# Patient Record
Sex: Female | Born: 1985 | Race: White | Hispanic: No | Smoking: Never smoker
Health system: Southern US, Community
[De-identification: ages and names within clinical notes are randomized; demographics above are authoritative.]

## PROBLEM LIST (undated history)

## (undated) DIAGNOSIS — F4310 Post-traumatic stress disorder, unspecified: Secondary | ICD-10-CM

## (undated) DIAGNOSIS — E559 Vitamin D deficiency, unspecified: Secondary | ICD-10-CM

## (undated) DIAGNOSIS — F909 Attention-deficit hyperactivity disorder, unspecified type: Secondary | ICD-10-CM

## (undated) DIAGNOSIS — G4700 Insomnia, unspecified: Secondary | ICD-10-CM

## (undated) DIAGNOSIS — R5383 Other fatigue: Secondary | ICD-10-CM

## (undated) HISTORY — DX: Vitamin D deficiency, unspecified: E55.9

## (undated) HISTORY — PX: OTHER SURGICAL HISTORY: SHX169

## (undated) HISTORY — PX: PANCREAS SURGERY: SHX731

## (undated) HISTORY — DX: Attention-deficit hyperactivity disorder, unspecified type: F90.9

## (undated) HISTORY — DX: Other fatigue: R53.83

## (undated) HISTORY — DX: Insomnia, unspecified: G47.00

## (undated) HISTORY — DX: Post-traumatic stress disorder, unspecified: F43.10

---

## 2009-09-29 ENCOUNTER — Emergency Department: Payer: Self-pay | Admitting: Emergency Medicine

## 2010-11-27 ENCOUNTER — Observation Stay: Payer: Self-pay | Admitting: Obstetrics and Gynecology

## 2010-12-02 ENCOUNTER — Observation Stay: Payer: Self-pay | Admitting: Obstetrics and Gynecology

## 2010-12-13 ENCOUNTER — Inpatient Hospital Stay: Payer: Self-pay

## 2011-05-19 ENCOUNTER — Emergency Department: Payer: Self-pay | Admitting: *Deleted

## 2013-08-31 ENCOUNTER — Ambulatory Visit: Payer: Self-pay | Admitting: Obstetrics and Gynecology

## 2013-08-31 LAB — HEMOGLOBIN: HGB: 13.9 g/dL (ref 12.0–16.0)

## 2013-08-31 LAB — HCG, QUANTITATIVE, PREGNANCY: BETA HCG, QUANT.: 3 m[IU]/mL

## 2013-09-05 LAB — PATHOLOGY REPORT

## 2014-04-12 ENCOUNTER — Emergency Department: Payer: Self-pay | Admitting: Emergency Medicine

## 2014-04-12 LAB — HCG, QUANTITATIVE, PREGNANCY: BETA HCG, QUANT.: 13378 m[IU]/mL — AB

## 2014-07-07 ENCOUNTER — Observation Stay: Payer: Self-pay | Admitting: Obstetrics and Gynecology

## 2014-07-07 LAB — URINALYSIS, COMPLETE
Bacteria: NONE SEEN
Bilirubin,UR: NEGATIVE
Blood: NEGATIVE
GLUCOSE, UR: NEGATIVE mg/dL (ref 0–75)
LEUKOCYTE ESTERASE: NEGATIVE
NITRITE: NEGATIVE
PH: 8 (ref 4.5–8.0)
Protein: NEGATIVE
RBC,UR: <1 /HPF (ref 0–5)
Specific Gravity: 1.006 (ref 1.003–1.030)
WBC UR: <1 /HPF (ref 0–5)

## 2014-09-04 ENCOUNTER — Observation Stay: Payer: Self-pay

## 2014-09-07 ENCOUNTER — Observation Stay: Payer: Self-pay | Admitting: Obstetrics & Gynecology

## 2014-09-10 ENCOUNTER — Inpatient Hospital Stay: Payer: Self-pay | Admitting: Obstetrics and Gynecology

## 2014-09-12 LAB — HEMATOCRIT: HCT: 32.3 % — AB (ref 35.0–47.0)

## 2014-10-12 NOTE — Op Note (Signed)
PATIENT NAME:  Megan Pacheco, Megan Pacheco MR#:  [id number] DATE OF BIRTH:  10-Jun-1986  DATE OF PROCEDURE:    PREOPERATIVE DIAGNOSIS:  Incomplete abortion.   POSTOPERATIVE DIAGNOSIS:  Incomplete abortion.   PROCEDURE:  Suction dilation and curettage.   SURGEON:  Laverta Baltimore, M.D.   ANESTHESIA:  General endotracheal anesthesia.   INDICATIONS:  This is a 29 year old gravida 3, para 1 patient at 10 weeks with heavy intermittent bleeding for the last four weeks.  Quantitative hCG going from 220 to 200.  Blood type O+.   PROCEDURE:  After adequate general endotracheal anesthesia, the patient was placed in the dorsal supine position with the legs in the candy cane stirrups.  Lower abdomen, perineal and vagina were prepped with Betadine.  Weighted speculum was placed in the posterior vaginal vault.  The bladder was straight catheterized yielding 150 mL of clear urine.  Anterior cervix was grasped with a single-tooth tenaculum.  Cervix was dilated to #20 Hanks dilator without difficulty.  A #7 Flexible curettage suction curette was advanced into the endometrial cavity and scant blood clot tissue was removed.  Sharp curettage was performed with good uterine cry throughout and a repeat suction curette yielded no additional tissue or blood.  Good hemostasis was noted.  There were no complications.  The patient tolerated the procedure well and was taken to the recovery room in good condition.   ESTIMATED BLOOD LOSS:  Minimal.   INTRAOPERATIVE FLUIDS:  500 mL.   URINE OUTPUT:  150 mL.     ____________________________ Boykin Nearing, MD tjs:ea D: 08/31/2013 16:46:45 ET T: 09/01/2013 06:14:34 ET JOB#: [id number]  cc: Boykin Nearing, MD, <Dictator> Boykin Nearing MD ELECTRONICALLY SIGNED 09/08/2013 11:24

## 2014-10-29 NOTE — H&P (Signed)
L&D Evaluation:  History:  HPI -CC: abdominal pain x 2wks, ?SROM at noon today -HPI: 29 y/o G31011 @ 30/2 (9wk u/s, [facility] 3/25) with above CC. preg c/b anxiety (on zoloft), umbilical hernia. Has been having lower abdominal pain for the past two weeks but states it has felt worse over the past day or two and coming on about q47m. took a shower at noon and had ?LOF that was clear; none since. No VB, decreased FM. no s/s of pre-x and nothing in the vagina in past 1-2 days prior to admission today.   Allergies NKDA   Exam:  Vital Signs af vs normal and stable with admit bp 120/94, rpt 113/76   General no apparent distress   Mental Status clear   Abdomen gravid, non-tender   Pelvic per RN closed internal, 1cm ext   Mebranes Intact   FHT 135 baseline, +accels, no decels, mod var   Ucx q2-54m   Other fern and nitrazine neg SSE: cx visually closed, no pooling and neg cough test   Impression:  Impression labor eval   Plan:  Comments *IUP: category I tracing *EOL: unfortunately cant do ffn given SVE by RN. rpt SVE in 1-2 hours. PO hydration and apap -follow up u/a   Electronic Signatures: Aletha Halim (MD)  (Signed 17-Jan-16 13:51)  Authored: L&D Evaluation   Last Updated: 17-Jan-16 13:51 by Aletha Halim (MD)

## 2014-10-29 NOTE — H&P (Signed)
L&D Evaluation:  History:  HPI -CC: C/O contractions x 2 days worsening in intensity this AM at 0430. Headaches, nausea, vision changes x 2 days. -HPI: 29 y/o G31011 @ 38.5 wks (9wk u/s, [facility] 3/25) with above CC. Headache is bitemporal and accompanied by blurred vision. Has a hx of migraines treated with Imetrex and percocet prior to pregnancy and Tylenol during pregnancy. Tylenol did not help headache. nausea began with headaches. Was able to keep down food last night, but this AM has only drank a little water. Stayed in bed till about 11AM,due to feeling nauseous and weak and contracting. Contraction started 2 days ago and were  infrequent (2x/hr), but became more regular this AM. Has had some scintillating scotomata with bending over/ changing positions. Vision has been a little blurry with the headache. No VB or LOF.  PNC at [facility] and c/b anxiety (on zoloft 50 mgm daily currently; was on paxil, xanax and nortriptyline before pregnancy), anemia, and umbilical hernia. 34# weight gain with pregnancy. Hx of SVD 8#7oz  female in 2012. Had gestational HTN with G1. LABS: O POS/ RNI/VI/ GBS positive.   Presents with contractions, headaches, visual changes and nausea   Patient's Medical History Migraines/ Anxiety/ Missed AB   Patient's Surgical History D&C  knee surgery   Medications Pre Natal Vitamins  Tylenol (Acetaminophen)  Zoloft 50 mgm daily   Allergies NKDA   Social History none   Family History Non-Contributory   ROS:  ROS see HPI   Exam:  Vital Signs on arrival 129/94, then 119/82, 117/86   Urine Protein trace to +1   General breathing with some contractions   Mental Status clear   Chest clear   Heart normal sinus rhythm, no murmur/gallop/rubs   Abdomen gravid, tender with contractions   Estimated Fetal Weight Average for gestational age   Fetal Position cephalic   Edema no edema   Reflexes 3+   Clonus negative   Pelvic no external lesions, closed/ 80%/-1    Mebranes Intact   FHT normal rate with no decels, reactive-Cat1   Ucx regular, q73min, some palpate mod   Skin dry   Impression:  Impression IUP at 38.5 weeks with headache, nausea,  contractions. R/O early labor. R/o preeclampsia/GTN. R/O migraine.   Plan:  Plan EFM/NST, monitor contractions and for cervical change, On arrival drew [facility] panel and sent PC ratio. IV hydration with IV Zofran administered with some decrease in nausea.   Comments LABS: BUN/cr=10/0.76, uric acid=7.6, H&H=11.3gm/dl&34.5%, plt=201. K=4.0,         Na+=134. PC ratio 110 mgm.  Feeling a little less nauseous after Zofran 4 mgm IV. BP=119/75. Headache is persisting although is now right temporal area. Contractions q3 min but cx is 0.5cm/50%/-3 and ballotable.  Will give morphine and Phenergan IM for ctx pain and headache and reevaluate in 2-3 hours. Continue IV hydration.   Electronic Signatures: Karene Fry (CNM)  (Signed 16-Mar-16 15:26)  Authored: L&D Evaluation   Last Updated: 16-Mar-16 15:26 by Karene Fry (CNM)

## 2015-01-22 LAB — CBC WITH DIFFERENTIAL/PLATELET
Basophil #: 0 10*3/uL (ref 0.0–0.1)
Basophil %: 0.3 %
EOS ABS: 0.1 10*3/uL (ref 0.0–0.7)
EOS PCT: 1 %
HCT: 33.7 % — ABNORMAL LOW (ref 35.0–47.0)
HGB: 10.7 g/dL — AB (ref 12.0–16.0)
Lymphocyte #: 2 10*3/uL (ref 1.0–3.6)
Lymphocyte %: 21.2 %
MCH: 27.2 pg (ref 26.0–34.0)
MCHC: 31.9 g/dL — AB (ref 32.0–36.0)
MCV: 85 fL (ref 80–100)
MONO ABS: 0.8 x10 3/mm (ref 0.2–0.9)
MONOS PCT: 8.5 %
Neutrophil #: 6.5 10*3/uL (ref 1.4–6.5)
Neutrophil %: 69 %
Platelet: 152 10*3/uL (ref 150–440)
RBC: 3.94 10*6/uL (ref 3.80–5.20)
RDW: 17 % — AB (ref 11.5–14.5)
WBC: 9.4 10*3/uL (ref 3.6–11.0)

## 2016-12-27 ENCOUNTER — Other Ambulatory Visit: Payer: Self-pay | Admitting: Family

## 2016-12-27 DIAGNOSIS — R935 Abnormal findings on diagnostic imaging of other abdominal regions, including retroperitoneum: Secondary | ICD-10-CM

## 2016-12-30 ENCOUNTER — Ambulatory Visit
Admission: RE | Admit: 2016-12-30 | Discharge: 2016-12-30 | Disposition: A | Discharge status: Home / Self Care | Payer: Self-pay | Source: Ambulatory Visit | Attending: Family | Admitting: Family

## 2016-12-30 ENCOUNTER — Encounter: Payer: Self-pay | Admitting: Radiology

## 2016-12-30 DIAGNOSIS — R935 Abnormal findings on diagnostic imaging of other abdominal regions, including retroperitoneum: Secondary | ICD-10-CM

## 2016-12-30 DIAGNOSIS — R198 Other specified symptoms and signs involving the digestive system and abdomen: Secondary | ICD-10-CM | POA: Insufficient documentation

## 2016-12-30 DIAGNOSIS — K862 Cyst of pancreas: Secondary | ICD-10-CM | POA: Insufficient documentation

## 2017-01-18 NOTE — Progress Notes (Signed)
  Oncology Nurse Navigator Documentation Order for EUS has been received from [place] at [facility]. No availability at [facility]. Order sent to [facility] with confirmation of receipt. They will contact her with date/time/instructions for procedure. Navigator Location: [facility] (01/18/17 1100)   )Navigator Encounter Type: Telephone;Letter/Fax/Email (01/18/17 1100)                         Barriers/Navigation Needs: Coordination of Care (01/18/17 1100)   Interventions: Coordination of Care (01/18/17 1100)   Coordination of Care: EUS (01/18/17 1100)                  Time Spent with Patient: 30 (01/18/17 1100)

## 2017-12-14 ENCOUNTER — Encounter: Payer: Self-pay | Admitting: Emergency Medicine

## 2017-12-14 ENCOUNTER — Inpatient Hospital Stay
Admission: EM | Admit: 2017-12-14 | Discharge: 2017-12-16 | DRG: 394 | Disposition: A | Discharge status: Home / Self Care | Payer: Medicaid Other | Attending: Internal Medicine | Admitting: Internal Medicine

## 2017-12-14 ENCOUNTER — Other Ambulatory Visit: Payer: Self-pay

## 2017-12-14 ENCOUNTER — Emergency Department: Payer: Medicaid Other

## 2017-12-14 DIAGNOSIS — D72829 Elevated white blood cell count, unspecified: Secondary | ICD-10-CM | POA: Diagnosis present

## 2017-12-14 DIAGNOSIS — R112 Nausea with vomiting, unspecified: Secondary | ICD-10-CM

## 2017-12-14 DIAGNOSIS — K862 Cyst of pancreas: Secondary | ICD-10-CM | POA: Diagnosis present

## 2017-12-14 DIAGNOSIS — K9189 Other postprocedural complications and disorders of digestive system: Secondary | ICD-10-CM | POA: Diagnosis present

## 2017-12-14 DIAGNOSIS — Z9081 Acquired absence of spleen: Secondary | ICD-10-CM | POA: Diagnosis not present

## 2017-12-14 DIAGNOSIS — Z7982 Long term (current) use of aspirin: Secondary | ICD-10-CM

## 2017-12-14 DIAGNOSIS — Z79899 Other long term (current) drug therapy: Secondary | ICD-10-CM

## 2017-12-14 DIAGNOSIS — D473 Essential (hemorrhagic) thrombocythemia: Secondary | ICD-10-CM | POA: Diagnosis present

## 2017-12-14 DIAGNOSIS — Z90411 Acquired partial absence of pancreas: Secondary | ICD-10-CM

## 2017-12-14 DIAGNOSIS — R1110 Vomiting, unspecified: Secondary | ICD-10-CM | POA: Diagnosis present

## 2017-12-14 DIAGNOSIS — K861 Other chronic pancreatitis: Secondary | ICD-10-CM | POA: Diagnosis present

## 2017-12-14 DIAGNOSIS — Y838 Other surgical procedures as the cause of abnormal reaction of the patient, or of later complication, without mention of misadventure at the time of the procedure: Secondary | ICD-10-CM | POA: Diagnosis present

## 2017-12-14 LAB — URINALYSIS, COMPLETE (UACMP) WITH MICROSCOPIC
BACTERIA UA: NONE SEEN
BILIRUBIN URINE: NEGATIVE
Glucose, UA: NEGATIVE mg/dL
HGB URINE DIPSTICK: NEGATIVE
Ketones, ur: NEGATIVE mg/dL
Leukocytes, UA: NEGATIVE
NITRITE: NEGATIVE
Protein, ur: 100 mg/dL — AB
Specific Gravity, Urine: 1.024 (ref 1.005–1.030)
pH: 8 (ref 5.0–8.0)

## 2017-12-14 LAB — COMPREHENSIVE METABOLIC PANEL
ALT: 20 U/L (ref 0–44)
ANION GAP: 13 (ref 5–15)
AST: 25 U/L (ref 15–41)
Albumin: 3.5 g/dL (ref 3.5–5.0)
Alkaline Phosphatase: 172 U/L — ABNORMAL HIGH (ref 38–126)
BUN: 16 mg/dL (ref 6–20)
CHLORIDE: 99 mmol/L (ref 98–111)
CO2: 24 mmol/L (ref 22–32)
Calcium: 9.3 mg/dL (ref 8.9–10.3)
Creatinine, Ser: 0.74 mg/dL (ref 0.44–1.00)
GFR calc non Af Amer: >60 mL/min (ref >60)
Glucose, Bld: 114 mg/dL — ABNORMAL HIGH (ref 70–99)
POTASSIUM: 4 mmol/L (ref 3.5–5.1)
SODIUM: 136 mmol/L (ref 135–145)
Total Bilirubin: 0.3 mg/dL (ref 0.3–1.2)
Total Protein: 8.6 g/dL — ABNORMAL HIGH (ref 6.5–8.1)

## 2017-12-14 LAB — CBC
HEMATOCRIT: 39.9 % (ref 35.0–47.0)
HEMOGLOBIN: 13.6 g/dL (ref 12.0–16.0)
MCH: 31.4 pg (ref 26.0–34.0)
MCHC: 34 g/dL (ref 32.0–36.0)
MCV: 92.3 fL (ref 80.0–100.0)
Platelets: 1508 10*3/uL (ref 150–440)
RBC: 4.32 MIL/uL (ref 3.80–5.20)
RDW: 14 % (ref 11.5–14.5)
WBC: 16 10*3/uL — AB (ref 3.6–11.0)

## 2017-12-14 LAB — LIPASE, BLOOD: Lipase: 37 U/L (ref 11–51)

## 2017-12-14 LAB — POCT PREGNANCY, URINE: PREG TEST UR: NEGATIVE

## 2017-12-14 MED ORDER — ONDANSETRON HCL 4 MG PO TABS: 4.0000 mg | ORAL_TABLET | Freq: Four times a day (QID) | ORAL | Status: DC | PRN

## 2017-12-14 MED ORDER — PIPERACILLIN-TAZOBACTAM 3.375 G IVPB
3.3750 g | Freq: Three times a day (TID) | INTRAVENOUS | Status: DC
  Administered 2017-12-14 – 2017-12-15 (×3): 3.375 g via INTRAVENOUS
  Filled 2017-12-14 (×3): qty 50

## 2017-12-14 MED ORDER — FENTANYL CITRATE (PF) 100 MCG/2ML IJ SOLN
50.0000 ug | INTRAMUSCULAR | Status: DC | PRN
  Administered 2017-12-14: 50 ug via INTRAVENOUS
  Filled 2017-12-14: qty 2

## 2017-12-14 MED ORDER — ALBUTEROL SULFATE (2.5 MG/3ML) 0.083% IN NEBU: 2.5000 mg | INHALATION_SOLUTION | RESPIRATORY_TRACT | Status: DC | PRN

## 2017-12-14 MED ORDER — HYDROMORPHONE HCL 1 MG/ML IJ SOLN
1.0000 mg | INTRAMUSCULAR | Status: DC | PRN
  Administered 2017-12-14 – 2017-12-15 (×3): 1 mg via INTRAVENOUS
  Filled 2017-12-14 (×4): qty 1

## 2017-12-14 MED ORDER — PROMETHAZINE HCL 25 MG/ML IJ SOLN
25.0000 mg | Freq: Once | INTRAMUSCULAR | Status: AC
  Administered 2017-12-14: 25 mg via INTRAVENOUS
  Filled 2017-12-14: qty 1

## 2017-12-14 MED ORDER — CIPROFLOXACIN HCL 500 MG PO TABS
500.0000 mg | ORAL_TABLET | Freq: Two times a day (BID) | ORAL | Status: DC
  Administered 2017-12-14 – 2017-12-16 (×4): 500 mg via ORAL
  Filled 2017-12-14 (×5): qty 1

## 2017-12-14 MED ORDER — ASPIRIN EC 81 MG PO TBEC
81.0000 mg | DELAYED_RELEASE_TABLET | Freq: Every day | ORAL | Status: DC
  Administered 2017-12-14 – 2017-12-15 (×2): 81 mg via ORAL
  Filled 2017-12-14 (×2): qty 1

## 2017-12-14 MED ORDER — HYDROMORPHONE HCL 1 MG/ML IJ SOLN
0.5000 mg | Freq: Once | INTRAMUSCULAR | Status: AC
  Administered 2017-12-14: 0.5 mg via INTRAVENOUS
  Filled 2017-12-14: qty 1

## 2017-12-14 MED ORDER — VENLAFAXINE HCL ER 37.5 MG PO CP24
150.0000 mg | ORAL_CAPSULE | Freq: Every day | ORAL | Status: DC
  Administered 2017-12-15: 150 mg via ORAL
  Filled 2017-12-14 (×3): qty 4

## 2017-12-14 MED ORDER — ENOXAPARIN SODIUM 40 MG/0.4ML ~~LOC~~ SOLN
40.0000 mg | SUBCUTANEOUS | Status: DC
  Administered 2017-12-14 – 2017-12-15 (×2): 40 mg via SUBCUTANEOUS
  Filled 2017-12-14 (×2): qty 0.4

## 2017-12-14 MED ORDER — ONDANSETRON HCL 4 MG/2ML IJ SOLN
4.0000 mg | Freq: Once | INTRAMUSCULAR | Status: AC | PRN
  Administered 2017-12-14: 4 mg via INTRAVENOUS
  Filled 2017-12-14: qty 2

## 2017-12-14 MED ORDER — IOPAMIDOL (ISOVUE-300) INJECTION 61%
100.0000 mL | Freq: Once | INTRAVENOUS | Status: AC | PRN
  Administered 2017-12-14: 100 mL via INTRAVENOUS

## 2017-12-14 MED ORDER — PANTOPRAZOLE SODIUM 40 MG PO TBEC
40.0000 mg | DELAYED_RELEASE_TABLET | Freq: Every day | ORAL | Status: DC
  Administered 2017-12-15: 40 mg via ORAL
  Filled 2017-12-14: qty 1

## 2017-12-14 MED ORDER — HYDROMORPHONE HCL 1 MG/ML IJ SOLN
1.0000 mg | Freq: Once | INTRAMUSCULAR | Status: AC
  Administered 2017-12-14: 1 mg via INTRAVENOUS
  Filled 2017-12-14: qty 1

## 2017-12-14 MED ORDER — ACETAMINOPHEN 325 MG PO TABS
650.0000 mg | ORAL_TABLET | Freq: Four times a day (QID) | ORAL | Status: DC | PRN
  Administered 2017-12-15: 650 mg via ORAL
  Filled 2017-12-14: qty 2

## 2017-12-14 MED ORDER — METOCLOPRAMIDE HCL 5 MG/ML IJ SOLN
5.0000 mg | Freq: Four times a day (QID) | INTRAMUSCULAR | Status: DC
  Administered 2017-12-14 – 2017-12-15 (×4): 5 mg via INTRAVENOUS
  Filled 2017-12-14 (×4): qty 2

## 2017-12-14 MED ORDER — OXYCODONE HCL 5 MG PO TABS
5.0000 mg | ORAL_TABLET | ORAL | Status: DC | PRN
  Administered 2017-12-14 – 2017-12-16 (×3): 5 mg via ORAL
  Filled 2017-12-14 (×3): qty 1

## 2017-12-14 MED ORDER — METOCLOPRAMIDE HCL 5 MG/ML IJ SOLN
10.0000 mg | Freq: Once | INTRAMUSCULAR | Status: AC
  Administered 2017-12-14: 10 mg via INTRAVENOUS
  Filled 2017-12-14: qty 2

## 2017-12-14 MED ORDER — ACETAMINOPHEN 650 MG RE SUPP: 650.0000 mg | Freq: Four times a day (QID) | RECTAL | Status: DC | PRN

## 2017-12-14 MED ORDER — ONDANSETRON HCL 4 MG/2ML IJ SOLN
4.0000 mg | Freq: Four times a day (QID) | INTRAMUSCULAR | Status: DC | PRN
  Administered 2017-12-15: 4 mg via INTRAVENOUS
  Filled 2017-12-14: qty 2

## 2017-12-14 MED ORDER — RISAQUAD PO CAPS
1.0000 | ORAL_CAPSULE | Freq: Every day | ORAL | Status: DC
  Administered 2017-12-14 – 2017-12-15 (×2): 1 via ORAL
  Filled 2017-12-14 (×3): qty 1

## 2017-12-14 MED ORDER — POLYETHYLENE GLYCOL 3350 17 G PO PACK: 17.0000 g | PACK | Freq: Every day | ORAL | Status: DC | PRN

## 2017-12-14 MED ORDER — DOCUSATE SODIUM 100 MG PO CAPS
100.0000 mg | ORAL_CAPSULE | Freq: Two times a day (BID) | ORAL | Status: DC
  Administered 2017-12-14 – 2017-12-15 (×2): 100 mg via ORAL
  Filled 2017-12-14 (×3): qty 1

## 2017-12-14 MED ORDER — MIRTAZAPINE 15 MG PO TABS
7.5000 mg | ORAL_TABLET | Freq: Every day | ORAL | Status: DC
  Administered 2017-12-14 – 2017-12-15 (×2): 7.5 mg via ORAL
  Filled 2017-12-14 (×2): qty 1

## 2017-12-14 NOTE — ED Notes (Signed)
First Nurse Note:  Report received from Laneta Simmers NP at [facility].  Patient had surgery for pancreas and spleen at [facility] on 11/22/17, was hospitalized at [facility] on 12/01/17 post-op and admitted to ICU.  Complaining of severe abdominal pain, N&V, and diarrhea.  States patient is pale, and diaphoretic in office.  Hx of elevated WBC. Pulse ox 97%, P-105, BP 105/72 in office.

## 2017-12-14 NOTE — ED Provider Notes (Signed)
[facility] Emergency Department Provider Note       Time seen: ----------------------------------------- 11:54 AM on 12/14/2017 -----------------------------------------   I have reviewed the triage vital signs and the nursing notes.  HISTORY   Chief Complaint Nausea    HPI Megan Pacheco is a 32 y.o. female with a history of chronic pancreatitis and pancreatic surgery who presents to the ED for severe abdominal pain as well as nausea and vomiting and diarrhea.  She has surgery for her pancreas and spleen at [facility] on June 4 and was hospitalized postoperatively a week later and admitted to the ICU.  She presents pale and diaphoretic with severe pain and vomiting.  Pain seems to be higher up than with her previous pain.  History reviewed. No pertinent past medical history.  There are no active problems to display for this patient.   Past Surgical History:  Procedure Laterality Date  . PANCREAS SURGERY      Allergies Patient has no allergy information on record.  Social History Social History   Tobacco Use  . Smoking status: Not on file  Substance Use Topics  . Alcohol use: Not on file  . Drug use: Not on file   Review of Systems Constitutional: Negative for fever. Cardiovascular: Negative for chest pain. Respiratory: Negative for shortness of breath. Gastrointestinal: Positive for abdominal pain, vomiting and diarrhea Musculoskeletal: Negative for back pain. Skin: Negative for rash. Neurological: Negative for headaches, focal weakness or numbness.  All systems negative/normal/unremarkable except as stated in the HPI  ____________________________________________   PHYSICAL EXAM:  VITAL SIGNS: ED Triage Vitals  Enc Vitals Group     BP 12/14/17 1130 105/76     Pulse Rate 12/14/17 1130 (!) 101     Resp 12/14/17 1130 18     Temp 12/14/17 1130 97.7 F (36.5 C)     Temp Source 12/14/17 1130 Oral     SpO2 12/14/17 1130 96 %     Weight  12/14/17 1131 120 lb (54.4 kg)     Height 12/14/17 1131 5\' 1"  (1.549 m)     Head Circumference --      Peak Flow --      Pain Score 12/14/17 1131 10     Pain Loc --      Pain Edu? --      Excl. in [place]? --    Constitutional: Alert and oriented.  Moderate distress Eyes: Conjunctivae are normal. Normal extraocular movements. ENT   Head: Normocephalic and atraumatic.   Nose: No congestion/rhinnorhea.   Mouth/Throat: Mucous membranes are moist.   Neck: No stridor. Cardiovascular: Normal rate, regular rhythm. No murmurs, rubs, or gallops. Respiratory: Normal respiratory effort without tachypnea nor retractions. Breath sounds are clear and equal bilaterally. No wheezes/rales/rhonchi. Gastrointestinal: Upper abdominal tenderness, no rebound or guarding.  Normal bowel sounds. Musculoskeletal: Nontender with normal range of motion in extremities. No lower extremity tenderness nor edema. Neurologic:  Normal speech and language. No gross focal neurologic deficits are appreciated.  Skin:  Skin is warm, dry and intact. No rash noted. Psychiatric: Mood and affect are normal. Speech and behavior are normal.  ____________________________________________  ED COURSE:  As part of my medical decision making, I reviewed the following data within the [place] History obtained from family if available, nursing notes, old chart and ekg, as well as notes from prior ED visits. Patient presented for severe abdominal pain and mostly vomiting with a complicated past medical history, we will assess with  labs and imaging as indicated at this time.   Procedures ____________________________________________   LABS (pertinent positives/negatives)  Labs Reviewed  CBC - Abnormal; Notable for the following components:      Result Value   WBC 16.0 (*)    Platelets 1,508 (*)    All other components within normal limits  URINALYSIS, COMPLETE (UACMP) WITH MICROSCOPIC - Abnormal; Notable  for the following components:   Color, Urine YELLOW (*)    APPearance HAZY (*)    Protein, ur 100 (*)    All other components within normal limits  COMPREHENSIVE METABOLIC PANEL - Abnormal; Notable for the following components:   Glucose, Bld 114 (*)    Total Protein 8.6 (*)    Alkaline Phosphatase 172 (*)    All other components within normal limits  LIPASE, BLOOD  POC URINE PREG, ED  POCT PREGNANCY, URINE    RADIOLOGY Images were viewed by me  CT the abdomen pelvis with contrast IMPRESSION: 1. Elongated fluid collection extending from the pancreatic resection margin into the proximal stomach with mass effect upon the stomach. 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 LEFT lower lobe atelectasis and small effusion. 3. Small fluid tract extends along the ventral peritoneal surface of the LEFT abdomen.   ____________________________________________  DIFFERENTIAL DIAGNOSIS   Gastritis, gastroenteritis, bowel obstruction unlikely, intra-abdominal infection, dehydration, electrolyte abnormality  FINAL ASSESSMENT AND PLAN  Abdominal pain, vomiting, postoperative fluid collection   Plan: The patient had presented for abdominal pain and vomiting with a history of chronic pancreatitis and pancreatic surgery for same. Patient's labs did reveal some leukocytosis and thrombocytosis which according to her surgeon are expected, however he did suggest placing her on Cipro twice a day to protect for any infection secondary to recent splenectomy. Patient's imaging was as dictated above.  I have discussed at length with her surgeon who will accept in transfer tomorrow if she is no better.  Currently she is feeling some better and is tolerating liquids.  We will have her admitted to the hospitalist service for observation.  Dr. Shearon Stalls [phone]   Laurence Aly, MD   Note: This note was generated in part or whole with voice  recognition software. Voice recognition is usually quite accurate but there are transcription errors that can and very often do occur. I apologize for any typographical errors that were not detected and corrected.     Earleen Newport, MD 12/14/17 [phone]

## 2017-12-14 NOTE — Consult Note (Signed)
Pharmacy Antibiotic Note  [place] K Luber is a 32 y.o. female admitted on 12/14/2017 with Intra-abdominal Infection.  Pharmacy has been consulted for Zosyn dosing.  Plan: Start Zosyn 3.375 IV EI every 8 hours.   Height: 5\' 1"  (154.9 cm) Weight: 120 lb (54.4 kg) IBW/kg (Calculated) : 47.8  Temp (24hrs), Avg:97.7 F (36.5 C), Min:97.7 F (36.5 C), Max:97.7 F (36.5 C)  Recent Labs  Lab 12/14/17 1137 12/14/17 1219  WBC 16.0*  --   CREATININE  --  0.74    Estimated Creatinine Clearance: 78.3 mL/min (by C-G formula based on SCr of 0.74 mg/dL).    Not on File  Antimicrobials this admission: 6/26 Zosyn >>  Thank you for allowing pharmacy to be a part of this patient's care.  Pernell Dupre, PharmD, BCPS Clinical Pharmacist 12/14/2017 5:14 PM

## 2017-12-14 NOTE — Progress Notes (Signed)
Patient admitted to the floor for abdominal pain, nausea, patient alert and oriented, dilaudid 1 mg given for pain with relief, will continue to monitor

## 2017-12-14 NOTE — ED Triage Notes (Signed)
Pt arrived with complaints of upper right quadrant pain that feels sharp. Pt states the pain has been persistent since yesterday and is accompanied with nausea and vomiting. Pt had 60% of pancreas removed on 6/4
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
## 2017-12-14 NOTE — Progress Notes (Signed)
PHARMACIST - PHYSICIAN ORDER COMMUNICATION  CONCERNING:  Non-Formulary Medication  DESCRIPTION:   This patient has an order for  Desvenlafaxine (Pristiq) 100mg  , which is NON-FORMULARY  Per [facility], a therapeutic substitution has been made.   The pharmacy has ordered Venlafaxine (Effxor XR) 150mg  per protocol.   Pernell Dupre, PharmD, BCPS Clinical Pharmacist 12/14/2017 5:51 PM

## 2017-12-14 NOTE — ED Notes (Signed)
Patient transported to CT 

## 2017-12-15 LAB — CBC
HCT: 34.8 % — ABNORMAL LOW (ref 35.0–47.0)
HEMOGLOBIN: 11.9 g/dL — AB (ref 12.0–16.0)
MCH: 31.8 pg (ref 26.0–34.0)
MCHC: 34.3 g/dL (ref 32.0–36.0)
MCV: 92.7 fL (ref 80.0–100.0)
Platelets: 1325 10*3/uL (ref 150–440)
RBC: 3.75 MIL/uL — ABNORMAL LOW (ref 3.80–5.20)
RDW: 14 % (ref 11.5–14.5)
WBC: 8.3 10*3/uL (ref 3.6–11.0)

## 2017-12-15 LAB — COMPREHENSIVE METABOLIC PANEL
ALBUMIN: 3.2 g/dL — AB (ref 3.5–5.0)
ALT: 17 U/L (ref 0–44)
ANION GAP: 11 (ref 5–15)
AST: 18 U/L (ref 15–41)
Alkaline Phosphatase: 153 U/L — ABNORMAL HIGH (ref 38–126)
BILIRUBIN TOTAL: 0.2 mg/dL — AB (ref 0.3–1.2)
BUN: 12 mg/dL (ref 6–20)
CO2: 26 mmol/L (ref 22–32)
CREATININE: 0.89 mg/dL (ref 0.44–1.00)
Calcium: 9 mg/dL (ref 8.9–10.3)
Chloride: 100 mmol/L (ref 98–111)
GFR calc Af Amer: >60 mL/min (ref >60)
GFR calc non Af Amer: >60 mL/min (ref >60)
GLUCOSE: 103 mg/dL — AB (ref 70–99)
Potassium: 3.8 mmol/L (ref 3.5–5.1)
Sodium: 137 mmol/L (ref 135–145)
TOTAL PROTEIN: 7.6 g/dL (ref 6.5–8.1)

## 2017-12-15 MED ORDER — HYDROMORPHONE HCL 1 MG/ML IJ SOLN
0.5000 mg | INTRAMUSCULAR | Status: DC | PRN
  Administered 2017-12-16: 0.5 mg via INTRAVENOUS
  Filled 2017-12-15: qty 1

## 2017-12-15 MED ORDER — METOCLOPRAMIDE HCL 5 MG/ML IJ SOLN: 5.0000 mg | Freq: Four times a day (QID) | INTRAMUSCULAR | Status: DC | PRN

## 2017-12-15 NOTE — Progress Notes (Signed)
[place] at [place] NAME: Megan Pacheco    MR#:  [id number]  DATE OF BIRTH:  10-Jul-1985  SUBJECTIVE:  CHIEF COMPLAINT:   Chief Complaint  Patient presents with  . Nausea   Better abdominal pain and nausea.  No vomiting or diarrhea. REVIEW OF SYSTEMS:  Review of Systems  Constitutional: Negative for chills, fever and malaise/fatigue.  HENT: Negative for sore throat.   Eyes: Negative for blurred vision and double vision.  Respiratory: Negative for cough, hemoptysis, shortness of breath, wheezing and stridor.   Cardiovascular: Negative for chest pain, palpitations, orthopnea and leg swelling.  Gastrointestinal: Positive for abdominal pain and nausea. Negative for blood in stool, diarrhea, melena and vomiting.  Genitourinary: Negative for dysuria, flank pain and hematuria.  Musculoskeletal: Negative for back pain and joint pain.  Skin: Negative for rash.  Neurological: Negative for dizziness, sensory change, focal weakness, seizures, loss of consciousness, weakness and headaches.  Endo/Heme/Allergies: Negative for polydipsia.  Psychiatric/Behavioral: Negative for depression. The patient is not nervous/anxious.     DRUG ALLERGIES:  Not on File VITALS:  Blood pressure 92/68, pulse (!) 58, temperature 97.7 F (36.5 C), resp. rate 16, height 5\' 1"  (1.549 m), weight 120 lb (54.4 kg), SpO2 98 %. PHYSICAL EXAMINATION:  Physical Exam  Constitutional: She is oriented to person, place, and time.  HENT:  Head: Normocephalic.  Mouth/Throat: Oropharynx is clear and moist.  Eyes: Pupils are equal, round, and reactive to light. Conjunctivae and EOM are normal. No scleral icterus.  Neck: Normal range of motion. Neck supple. No JVD present. No tracheal deviation present.  Cardiovascular: Normal rate, regular rhythm and normal heart sounds. Exam reveals no gallop.  No murmur heard. Pulmonary/Chest: Effort normal and breath sounds normal. No  respiratory distress. She has no wheezes. She has no rales.  Abdominal: Soft. Bowel sounds are normal. She exhibits no distension. There is no tenderness. There is no rebound.  Musculoskeletal: Normal range of motion. She exhibits no edema or tenderness.  Neurological: She is alert and oriented to person, place, and time. No cranial nerve deficit.  Skin: No rash noted. No erythema.   LABORATORY PANEL:  Female CBC Recent Labs  Lab 12/15/17 0441  WBC 8.3  HGB 11.9*  HCT 34.8*  PLT 1,325*   ------------------------------------------------------------------------------------------------------------------ Chemistries  Recent Labs  Lab 12/15/17 0441  NA 137  K 3.8  CL 100  CO2 26  GLUCOSE 103*  BUN 12  CREATININE 0.89  CALCIUM 9.0  AST 18  ALT 17  ALKPHOS 153*  BILITOT 0.2*   RADIOLOGY:  No results found. ASSESSMENT AND PLAN:   *Intractable vomiting and abdominal pain likely due to fluid collection which could be postsurgical or a newly developing pancreatic pseudocyst.   The patient the symptoms is improving.  Per Dr. Shearon Stalls, [facility] surgeon, no indication for surgery and transferred to [facility] for now.  But need to monitor in the hospital today.  If the symptoms get worse, the patient may need to transfer to [facility], but if symptoms improved the patient may be discharged to home tomorrow and to follow-up in his [facility] office as outpatient with the 1 week. Discontinue Zosyn.  Continue ciprofloxacin for 1 week per Dr. Shearon Stalls, [facility] surgeon.  continue IV Reglan.  Pain medications oral and IV prn.  Leukocytosis improved.  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 Shearon Stalls. All the records are reviewed and case discussed with Care Management/Social Worker. Management plans  discussed with the patient, family and they are in agreement.  CODE STATUS: Full Code  TOTAL TIME TAKING CARE OF THIS PATIENT: 35 minutes.   More than 50% of the time was spent in counseling/coordination of  care: YES  POSSIBLE D/C IN 1-2 DAYS, DEPENDING ON CLINICAL CONDITION.   Demetrios Loll M.D on 12/15/2017 at 5:08 PM  Between 7am to 6pm - Pager - [phone]  After 6pm go to [email or web address] - [place] [facility]

## 2017-12-16 MED ORDER — CIPROFLOXACIN HCL 500 MG PO TABS: 500.0000 mg | ORAL_TABLET | Freq: Two times a day (BID) | ORAL | 0 refills | Status: DC

## 2017-12-16 NOTE — Discharge Instructions (Signed)
Increase activity slowly °

## 2017-12-16 NOTE — Discharge Summary (Signed)
Megan Pacheco NAME: Megan Pacheco    MR#:  [id number]  DATE OF BIRTH:  1985/10/02  DATE OF ADMISSION:  12/14/2017   ADMITTING PHYSICIAN: Hillary Bow, MD  DATE OF DISCHARGE: 12/16/2017  PRIMARY CARE PHYSICIAN: Laneta Simmers, NP   ADMISSION DIAGNOSIS:  Non-intractable vomiting with nausea, unspecified vomiting type [R11.2] DISCHARGE DIAGNOSIS:  Active Problems:   Vomiting  SECONDARY DIAGNOSIS:  History reviewed. No pertinent past medical history. HOSPITAL COURSE:  *Intractable vomiting and abdominal pain likely due to fluid collection which could be postsurgical or a newly developing pancreatic pseudocyst.  Discontinued Zosyn. Continue ciprofloxacin for 1 week per Dr. Shearon Stalls, [facility] surgeon.  She was treated with IV Reglan. Pain medications oral and IV prn.  Leukocytosis improved. The patient the symptoms improved.  Per Dr. Shearon Stalls, [facility] surgeon, no indication for surgery and transferred to [facility].  Follow-up Dr. Shearon Stalls as outpatient within 1 week.  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 CONSULTS OBTAINED:   DRUG ALLERGIES:  Not on File DISCHARGE MEDICATIONS:   Allergies as of 12/16/2017   Not on File     Medication List    TAKE these medications   acidophilus Caps capsule Take 1 capsule by mouth daily.   aspirin EC 81 MG tablet Take 81 mg by mouth daily.   ciprofloxacin 500 MG tablet Commonly known as:  CIPRO Take 1 tablet (500 mg total) by mouth 2 (two) times daily.   Desvenlafaxine Fumarate ER 100 MG Tb24 Take 100 mg by mouth daily.   docusate sodium 100 MG capsule Commonly known as:  COLACE Take 100 mg by mouth 2 (two) times daily.   fexofenadine 180 MG tablet Commonly known as:  ALLEGRA Take 180 mg by mouth daily as needed for allergies.   metoCLOPramide 10 MG tablet Commonly known as:  REGLAN Take 10 mg by mouth 4 (four) times daily -  before  meals and at bedtime.   mirtazapine 15 MG tablet Commonly known as:  REMERON Take 7.5 mg by mouth at bedtime.   MULTI-VITAMINS Tabs Take 1 tablet by mouth daily.   NEXIUM 40 MG capsule Generic drug:  esomeprazole Take 40 mg by mouth daily.   ondansetron 8 MG tablet Commonly known as:  ZOFRAN Take 8 mg by mouth every 8 (eight) hours as needed for nausea or vomiting.   polyethylene glycol packet Commonly known as:  MIRALAX / GLYCOLAX Take 17 g by mouth every other day.   PROVENTIL HFA 108 (90 Base) MCG/ACT inhaler Generic drug:  albuterol Inhale 2 puffs into the lungs every 4 (four) hours as needed for wheezing or shortness of breath.   traMADol 50 MG tablet Commonly known as:  ULTRAM Take 50-100 mg by mouth every 8 (eight) hours as needed for moderate pain.        DISCHARGE INSTRUCTIONS:  See AVS.  If you experience worsening of your admission symptoms, develop shortness of breath, life threatening emergency, suicidal or homicidal thoughts you must seek medical attention immediately by calling 911 or calling your MD immediately  if symptoms less severe.  You Must read complete instructions/literature along with all the possible adverse reactions/side effects for all the Medicines you take and that have been prescribed to you. Take any new Medicines after you have completely understood and accpet all the possible adverse reactions/side effects.   Please note  You were cared for by a hospitalist during your hospital stay.  If you have any questions about your discharge medications or the care you received while you were in the hospital after you are discharged, you can call the unit and asked to speak with the hospitalist on call if the hospitalist that took care of you is not available. Once you are discharged, your primary care physician will handle any further medical issues. Please note that NO REFILLS for any discharge medications will be authorized once you are discharged,  as it is imperative that you return to your primary care physician (or establish a relationship with a primary care physician if you do not have one) for your aftercare needs so that they can reassess your need for medications and monitor your lab values.    On the day of Discharge:  VITAL SIGNS:  Blood pressure 105/74, pulse 64, temperature 97.6 F (36.4 C), temperature source Oral, resp. rate 20, height 5\' 1"  (1.549 m), weight 120 lb (54.4 kg), SpO2 98 %. PHYSICAL EXAMINATION:  GENERAL:  32 y.o.-year-old patient lying in the bed with no acute distress.  EYES: Pupils equal, round, reactive to light and accommodation. No scleral icterus. Extraocular muscles intact.  HEENT: Head atraumatic, normocephalic. Oropharynx and nasopharynx clear.  NECK:  Supple, no jugular venous distention. No thyroid enlargement, no tenderness.  LUNGS: Normal breath sounds bilaterally, no wheezing, rales,rhonchi or crepitation. No use of accessory muscles of respiration.  CARDIOVASCULAR: S1, S2 normal. No murmurs, rubs, or gallops.  ABDOMEN: Soft, non-tender, non-distended. Bowel sounds present. No organomegaly or mass.  EXTREMITIES: No pedal edema, cyanosis, or clubbing.  NEUROLOGIC: Cranial nerves II through XII are intact. Muscle strength 5/5 in all extremities. Sensation intact. Gait not checked.  PSYCHIATRIC: The patient is alert and oriented x 3.  SKIN: No obvious rash, lesion, or ulcer.  DATA REVIEW:   CBC Recent Labs  Lab 12/15/17 0441  WBC 8.3  HGB 11.9*  HCT 34.8*  PLT 1,325*    Chemistries  Recent Labs  Lab 12/15/17 0441  NA 137  K 3.8  CL 100  CO2 26  GLUCOSE 103*  BUN 12  CREATININE 0.89  CALCIUM 9.0  AST 18  ALT 17  ALKPHOS 153*  BILITOT 0.2*     Microbiology Results  No results found for this or any previous visit.  RADIOLOGY:  No results found.   Management plans discussed with the patient, family and they are in agreement.  CODE STATUS: Full Code   TOTAL TIME  TAKING CARE OF THIS PATIENT: 32 minutes.    Demetrios Loll M.D on 12/16/2017 at 9:51 AM  Between 7am to 6pm - Pager - [phone]  After 6pm go to [email or web address] - Proofreader  [facility] [place] [facility]  Office  [phone]  CC: Primary care physician; Laneta Simmers, NP   Note: This dictation was prepared with Dragon dictation along with smaller phrase technology. Any transcriptional errors that result from this process are unintentional.

## 2017-12-16 NOTE — Care Management (Signed)
Patient listed to be without insurance. Patient currently sees [facility] for PCP services and also obtains medications through there. Per the patient her medications are affordable but does prefer a coupon for her abx that she will discharged on. No other identified RNCM needs. Will sign off.

## 2017-12-16 NOTE — Progress Notes (Signed)
AVS reviewed with patient. Patient opting to not see social work as her ride is here and she needs to go. Patient has no discharge needs. All questions answered.

## 2018-10-05 ENCOUNTER — Telehealth: Payer: Self-pay | Admitting: Family

## 2018-10-05 DIAGNOSIS — R112 Nausea with vomiting, unspecified: Secondary | ICD-10-CM

## 2018-10-05 MED ORDER — ONDANSETRON HCL 8 MG PO TABS: 8.0000 mg | ORAL_TABLET | Freq: Three times a day (TID) | ORAL | 0 refills | Status: DC | PRN

## 2018-10-05 NOTE — Progress Notes (Signed)

## 2018-12-08 NOTE — Progress Notes (Signed)
Greater than 5 minutes, yet less than 10 minutes of time have been spent researching, coordinating, and implementing care for this patient today.  Thank you for the details you included in the comment boxes. Those details are very helpful in determining the best course of treatment for you and help us to provide the best care.  

## 2019-02-04 ENCOUNTER — Telehealth: Payer: Self-pay | Admitting: Physician Assistant

## 2019-02-04 DIAGNOSIS — R109 Unspecified abdominal pain: Secondary | ICD-10-CM

## 2019-02-04 NOTE — Progress Notes (Signed)
Based on what you shared with me, I feel your condition warrants further evaluation and I recommend that you be seen for a face to face office visit.  As mentioned in the after visit summary you received from the ED, if your symptoms worsen or do not get better or become different in character, you may warrant further evaluation. Since your symptoms are persisting and you now have new vomiting, I suggest further evaluation at the ED as recommended in the attached after visit summary.   NOTE: If you entered your credit card information for this eVisit, you will not be charged. You may see a "hold" on your card for the $35 but that hold will drop off and you will not have a charge processed.  If you are having a true medical emergency please call 911.     The following sites will take your insurance:  . [facility]    [phone]                  Get Driving Directions  [address], [place] [address] . 10 am to 8 pm Monday-Friday . 12 pm to 8 pm Saturday-Sunday   . [facility] at [place]                  Get Driving Directions  [address], [place], [place] [address] . 8 am to 8 pm Monday-Friday . 9 am to 6 pm Saturday . 11 am to 6 pm Sunday   . [facility] at [place]                  Get Driving Directions   [address] [place], [place] [address] . 8 am to 8 pm Monday-Friday . 8 am to 4 pm Saturday-Sunday    . [facility] at [place]                    Get Driving Directions  [phone]  [address], [place], [place] [address]  . Monday-Friday, 12 PM to 6 PM    Your e-visit answers were reviewed by a board certified advanced clinical practitioner to complete your personal care plan.  Thank you for using e-Visits.  I have spent 5 minutes in review of e-visit questionnaire, review and updating patient chart,  medical decision making and response to patient.    Tenna Delaine, PA-C

## 2019-06-17 IMAGING — CT CT ABD-PELV W/ CM
2 of 4 series · 15 of 46 positions shown, 17 images · IV contrast (APPLIED)
Comparison: CT 11/27/2016

CLINICAL DATA: Distal pancreatectomy and splenectomy.

EXAM:
CT ABDOMEN AND PELVIS WITH CONTRAST
TECHNIQUE: Multidetector CT imaging of the abdomen and pelvis was performed
using the standard protocol following bolus administration of
intravenous contrast.
CONTRAST:  100mL LM5S9D-4ZZ IOPAMIDOL (LM5S9D-4ZZ) INJECTION 61%

[Series 2: routine abd/pel with · axial · 0.66mm/px · z∈[-563,-168]mm · 12 of 91 slices shown, 14 images]
[im 8/91  soft-tissue]
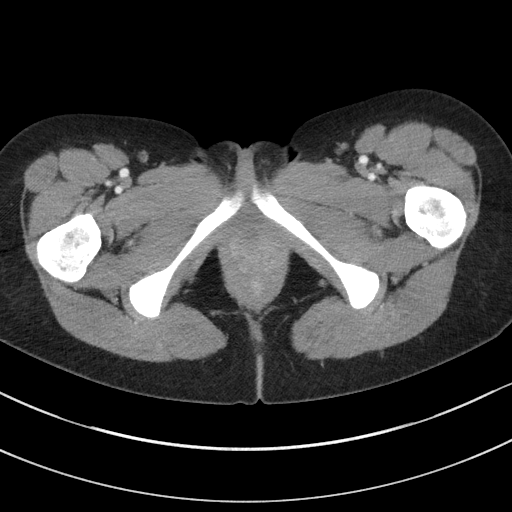
[im 8/91  bone]
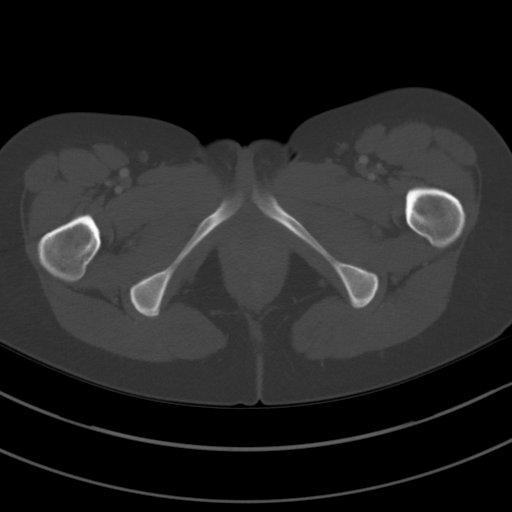
[im 15/91  soft-tissue]
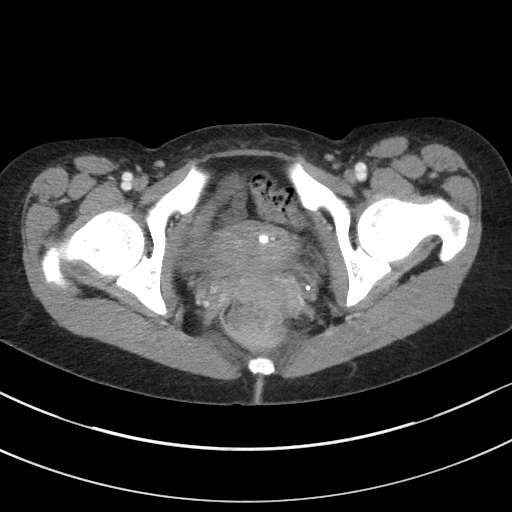
[im 22/91  soft-tissue]
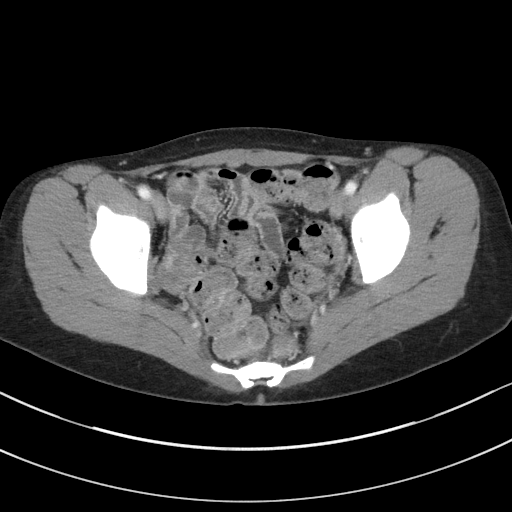
[im 29/91  soft-tissue]
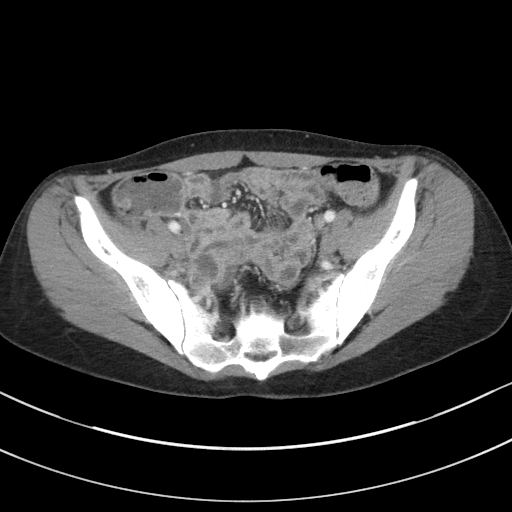
[im 37/91  soft-tissue]
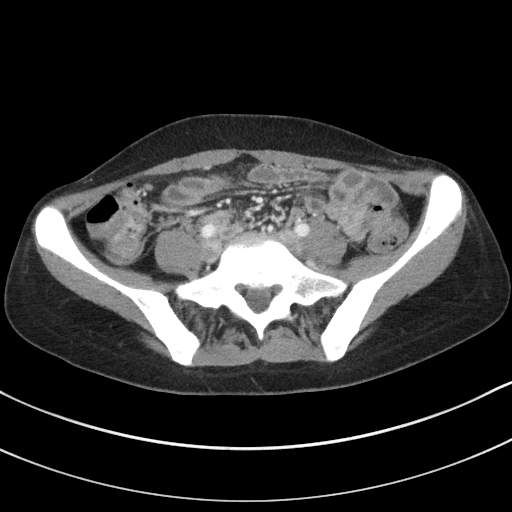
[im 44/91  soft-tissue]
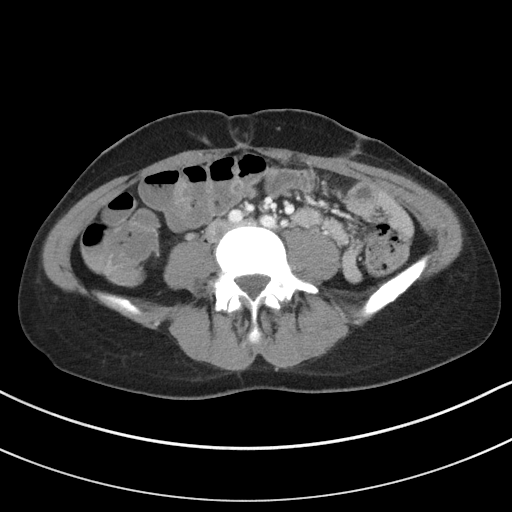
[im 51/91  soft-tissue]
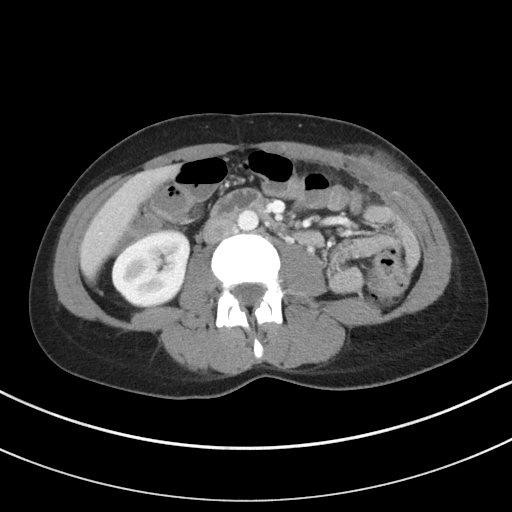
[im 58/91  soft-tissue]
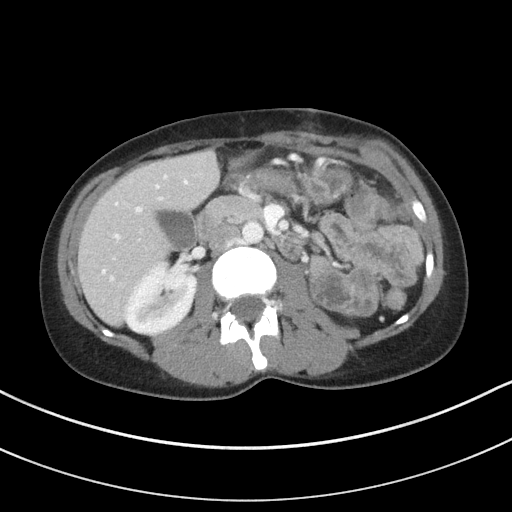
[im 65/91  soft-tissue]
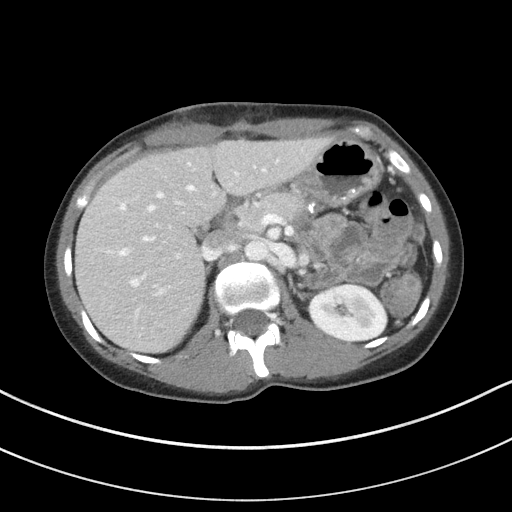
[im 65/91  bone]
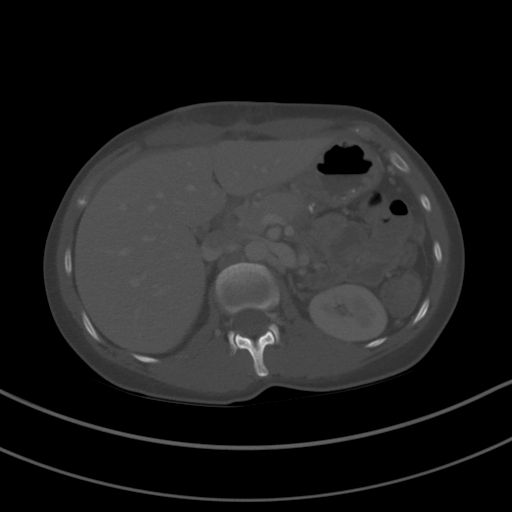
[im 73/91  soft-tissue]
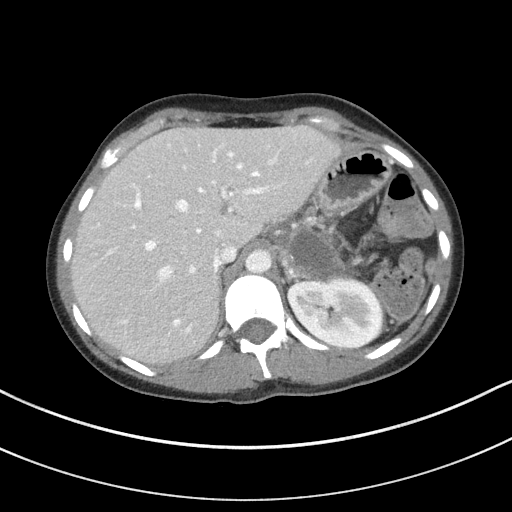
[im 80/91  soft-tissue]
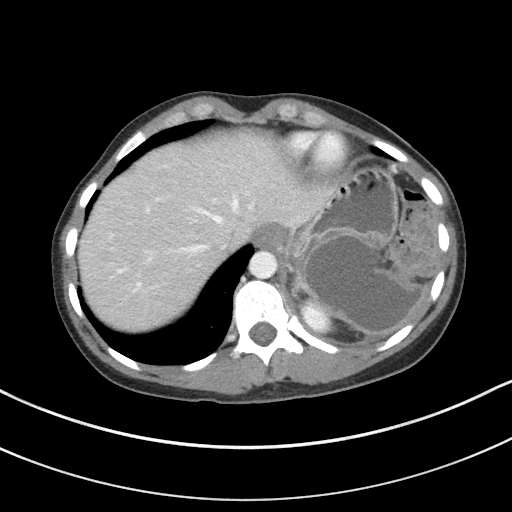
[im 87/91  soft-tissue]
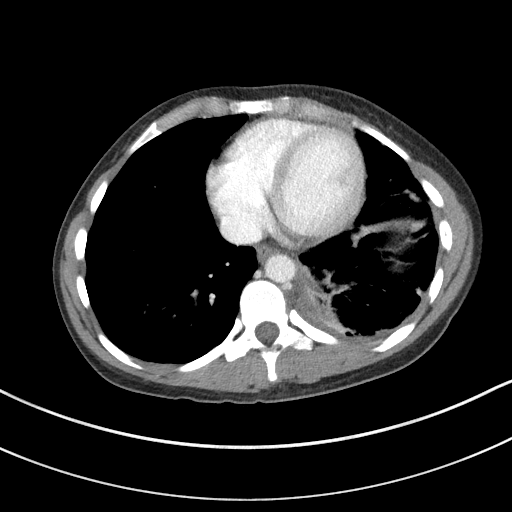

[Series 5: coronal st · coronal · 0.61mm/px · 3 of 74 slices shown]
[im 25/74  soft-tissue]
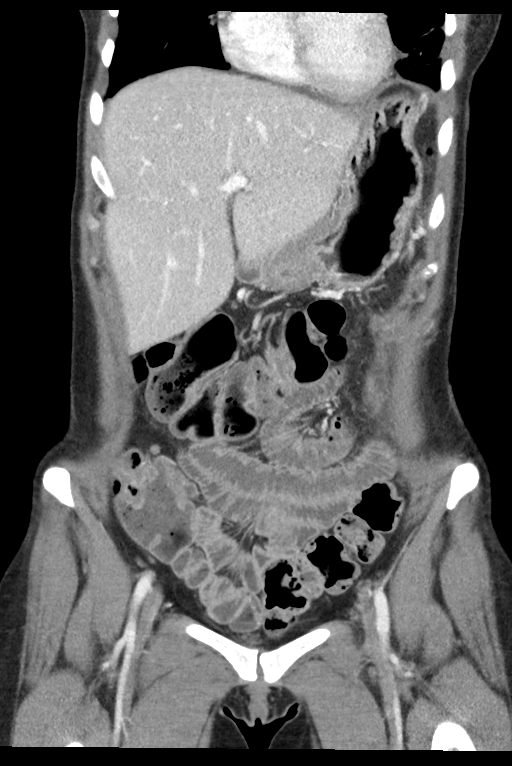
[im 33/74  soft-tissue]
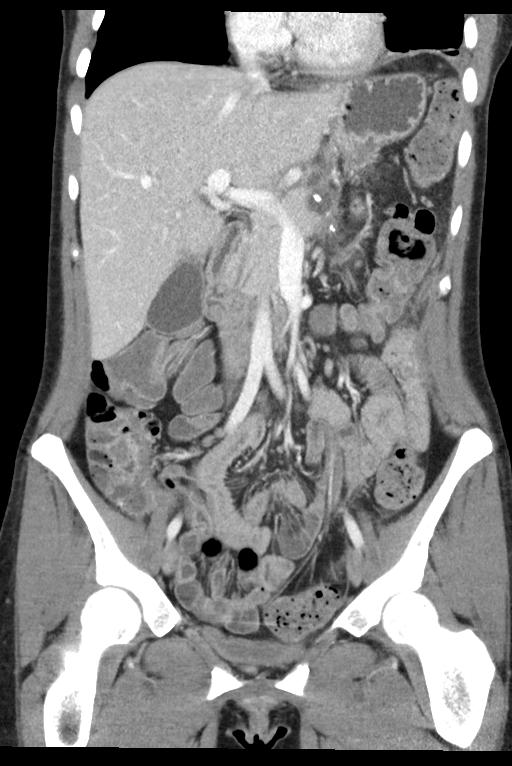
[im 41/74  soft-tissue]
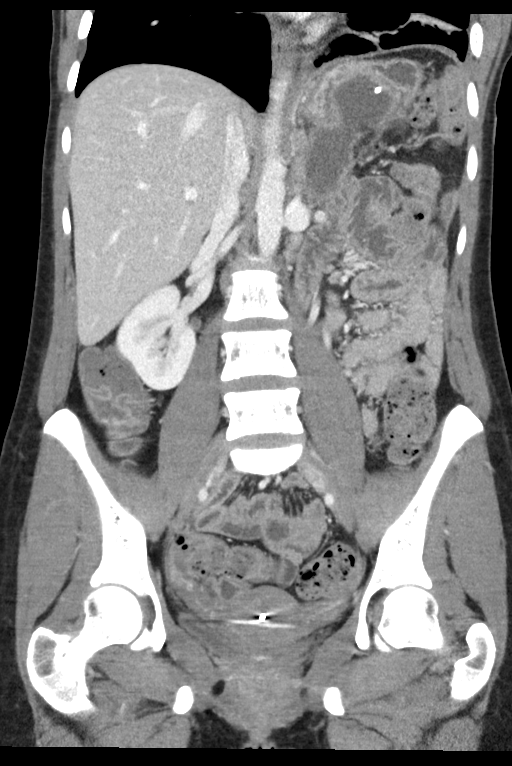

[15 of 46 positions shown; findings below may reference images not displayed]

FINDINGS: Lower chest: Lung bases are clear.

Hepatobiliary: Atelectasis and small effusion at the LEFT lung base.

Pancreas: Partial pancreatectomy with resection of the mid body and
tail the pancreas. There is elongated fluid collection extending
from the resection margin to the undersurface of the diaphragm. This
fluid collection is thick-walled with central homogeneous
low-attenuation fluid. In greatest axial dimension fluid collection
measures 7.4 by 4.8 cm along the proximal stomach. High-density
metallic object within the cystic collection adjacent to the stomach
(image [DATE]).

The distal body and pancreatic head are normal. No pancreatic duct
dilatation

Spleen: Post splenectomy

Adrenals/urinary tract: Adrenal glands and kidneys are normal. The
ureters and bladder normal.

Stomach/Bowel: Stomach, small bowel, appendix, and cecum are normal.
The colon and rectosigmoid colon are normal.

Vascular/Lymphatic: Abdominal aorta is normal caliber. No periportal
or retroperitoneal adenopathy. No pelvic adenopathy.

Reproductive: IUD in expected location.  Ovaries normal

Other: There is a small fluid tract extending from the
peripancreatic fluid collection at the level of the stomach along
the lateral aspect of the ventral peritoneal surface (image [DATE]).

Musculoskeletal: No aggressive osseous lesion.
IMPRESSION: 1. Elongated fluid collection extending from the pancreatic
resection margin into the proximal stomach with mass effect upon the
stomach. Fluid collections thick-walled suggesting pancreatic
pseudocysts.
2. Post splenectomy. LEFT lower lobe atelectasis and small effusion.
3. Small fluid tract extends along the ventral peritoneal surface of
the LEFT abdomen.

## 2020-03-25 ENCOUNTER — Other Ambulatory Visit: Payer: Self-pay | Admitting: Physician Assistant

## 2020-03-27 LAB — QUANTIFERON-TB GOLD PLUS
QuantiFERON Mitogen Value: >10 IU/mL
QuantiFERON Nil Value: 0.21 IU/mL
QuantiFERON TB1 Ag Value: 0.21 IU/mL
QuantiFERON TB2 Ag Value: 0.23 IU/mL
QuantiFERON-TB Gold Plus: NEGATIVE

## 2021-11-09 ENCOUNTER — Ambulatory Visit (INDEPENDENT_AMBULATORY_CARE_PROVIDER_SITE_OTHER): Payer: BC Managed Care – PPO

## 2021-11-09 ENCOUNTER — Ambulatory Visit
Admission: RE | Admit: 2021-11-09 | Discharge: 2021-11-09 | Disposition: A | Discharge status: Home / Self Care | Payer: BC Managed Care – PPO | Source: Ambulatory Visit | Attending: Internal Medicine | Admitting: Internal Medicine

## 2021-11-09 VITALS — BP 102/63 | HR 90 | Temp 98.0°F | Resp 18

## 2021-11-09 DIAGNOSIS — S99911A Unspecified injury of right ankle, initial encounter: Secondary | ICD-10-CM

## 2021-11-09 NOTE — ED Triage Notes (Signed)
Pt presents with left ankle pain after twisting it 1 month ago.

## 2021-11-09 NOTE — ED Provider Notes (Signed)
[facility]    CSN: [id number] Arrival date & time: 11/09/21  1238      History   Chief Complaint Chief Complaint  Patient presents with   Ankle Pain    Twisted my ankle about a month ago and still having pain, I just want to make sure I didn't break anything. - Entered by patient    HPI Megan Pacheco is a 36 y.o. female who inverted her ankle 4/29 and heard a pop. It took her a while to be able to bare weight. She thought she sprained it again as she has multiple times in the past, so placed her ankle brace and has been wearing it qd, but she is not better. Her pain is on medial ankle the worst, below malleolus; anterior and medial ankle. Area never bruised.    History reviewed. No pertinent past medical history.  Patient Active Problem List   Diagnosis Date Noted   Vomiting 12/14/2017    Past Surgical History:  Procedure Laterality Date   PANCREAS SURGERY      OB History   No obstetric history on file.      Home Medications    Prior to Admission medications   Medication Sig Start Date End Date Taking? Authorizing Provider  albuterol (VENTOLIN HFA) 108 (90 Base) MCG/ACT inhaler Inhale 2 puffs into the lungs every 4 (four) hours as needed for wheezing or shortness of breath.    Yes [provider]  BELSOMRA 5 MG TABS Take 1 tablet by mouth at bedtime. 10/22/21  Yes [provider]  clonazePAM (KLONOPIN) 0.5 MG tablet Take 0.5 mg by mouth 2 (two) times daily as needed. 10/19/21  Yes [provider]  diphenhydrAMINE HCl (BENADRYL ALLERGY PO) Take by mouth.   Yes [provider]  loratadine-pseudoephedrine (CLARITIN-D 12 HOUR) 5-120 MG tablet Take 1 tablet by mouth 2 (two) times daily.   Yes [provider]  STRATTERA 25 MG capsule Take by mouth. 11/03/21  Yes [provider]  traZODone (DESYREL) 50 MG tablet Take 50 mg by mouth at bedtime. 10/30/21  Yes [provider]  TRI-ESTARYLLA 0.18/0.215/0.25  MG-35 MCG tablet Take 1 tablet by mouth daily. 10/06/21  Yes [provider]  acidophilus (RISAQUAD) CAPS capsule Take 1 capsule by mouth daily.    [provider]  aspirin EC 81 MG tablet Take 81 mg by mouth daily.    [provider]  ciprofloxacin (CIPRO) 500 MG tablet Take 1 tablet (500 mg total) by mouth 2 (two) times daily. 12/16/17   Demetrios Loll, MD  Desvenlafaxine Fumarate ER 100 MG TB24 Take 100 mg by mouth daily.    [provider]  escitalopram (LEXAPRO) 10 MG tablet Take 10 mg by mouth daily. 11/03/21   [provider]  escitalopram (LEXAPRO) 20 MG tablet Take 20 mg by mouth daily. 11/03/21   [provider]  esomeprazole (NEXIUM) 40 MG capsule Take 40 mg by mouth daily. 02/01/08   [provider]  fexofenadine (ALLEGRA) 180 MG tablet Take 180 mg by mouth daily as needed for allergies.  10/17/08   [provider]  mirtazapine (REMERON) 15 MG tablet Take 7.5 mg by mouth at bedtime. 11/28/17 12/28/17  [provider]  Multiple Vitamin (MULTI-VITAMINS) TABS Take 1 tablet by mouth daily.    [provider]  ondansetron (ZOFRAN) 8 MG tablet Take 1 tablet (8 mg total) by mouth every 8 (eight) hours as needed for nausea or vomiting. 10/05/18  Dutch Quint B, FNP  traMADol (ULTRAM) 50 MG tablet Take 50-100 mg by mouth every 8 (eight) hours as needed for moderate pain.  12/09/17   [provider]    Family History No family history on file.  Social History Social History   Tobacco Use   Smoking status: Never   Smokeless tobacco: Never  Vaping Use   Vaping Use: Never used  Substance Use Topics   Alcohol use: Not Currently   Drug use: Never     Allergies   Uncaria tomentosa (cats claw)   Review of Systems Review of Systems  Musculoskeletal:  Positive for arthralgias, gait problem and joint swelling.  Skin:  Negative for color change, rash and wound.    Physical Exam Triage Vital  Signs ED Triage Vitals  Enc Vitals Group     BP 11/09/21 1349 102/63     Pulse Rate 11/09/21 1349 90     Resp 11/09/21 1349 18     Temp 11/09/21 1349 98 F (36.7 C)     Temp Source 11/09/21 1349 Oral     SpO2 11/09/21 1349 99 %     Weight --      Height --      Head Circumference --      Peak Flow --      Pain Score 11/09/21 1348 6     Pain Loc --      Pain Edu? --      Excl. in [place]? --    No data found.  Updated Vital Signs BP 102/63 (BP Location: Left Arm)   Pulse 90   Temp 98 F (36.7 C) (Oral)   Resp 18   LMP 10/12/2021   SpO2 99%   Visual Acuity Right Eye Distance:   Left Eye Distance:   Bilateral Distance:    Right Eye Near:   Left Eye Near:    Bilateral Near:     Physical Exam Vitals and nursing note reviewed.  Constitutional:      General: She is not in acute distress.    Appearance: Normal appearance. She is not toxic-appearing.  HENT:     Right Ear: External ear normal.     Left Ear: External ear normal.  Eyes:     General: No scleral icterus.    Conjunctiva/sclera: Conjunctivae normal.  Cardiovascular:     Pulses: Normal pulses.  Pulmonary:     Effort: Pulmonary effort is normal.  Musculoskeletal:     Cervical back: Neck supple.     Comments: R ANKLE- Has mild swelling of the lateral malleolus which is tender to palpation, as well as anterior ankle. Has point tenderness below medial malleolus. ROM provokes pain, but is full. Strength is 3/5 against my strength due to pain.   Skin:    General: Skin is warm and dry.     Findings: No rash.  Neurological:     Mental Status: She is alert and oriented to person, place, and time.     Gait: Gait normal.  Psychiatric:        Mood and Affect: Mood normal.        Behavior: Behavior normal.        Thought Content: Thought content normal.        Judgment: Judgment normal.     UC Treatments / Results  Labs (all labs ordered are listed, but only abnormal results are displayed) Labs Reviewed - No  data to display  EKG   Radiology DG  Ankle Complete Right  Result Date: 11/09/2021 CLINICAL DATA:  RIGHT ankle injury, 1 month ago in a 36 year old female. EXAM: RIGHT ANKLE - COMPLETE 3+ VIEW COMPARISON:  None available FINDINGS: Perhaps mild swelling about the medial greater than lateral malleolus. Well corticated bone fragment inferior to medial malleolus, lateral to talus. No widening of the syndesmosis with ankle mortise intact on submitted images. IMPRESSION: 1. Mild swelling about the medial greater than lateral malleolus. 2. Well corticated bone fragment inferior to the medial malleolus, this may reflect sequela of remote injury based on appearance. Correlate with point tenderness in this area. Electronically Signed   By: Zetta Bills M.D.   On: 11/09/2021 13:24    Procedures Procedures (including critical care time)  Medications Ordered in UC Medications - No data to display  Initial Impression / Assessment and Plan / UC Course  I have reviewed the triage vital signs and the nursing notes. Pertinent  imaging results that were available during my care of the patient were reviewed by me and considered in my medical decision making (see chart for details).  R ankle pain and injury  Was placed on Walker boot and referred to ortho since she has point tenderness on abnormal reading of the xray.    Final Clinical Impressions(s) / UC Diagnoses   Final diagnoses:  Ankle injury, right, initial encounter     Discharge Instructions      Follow up with orthopedics sometime this week.     ED Prescriptions   None    PDMP not reviewed this encounter.   Shelby Mattocks, [place] 11/09/21 1419

## 2021-11-09 NOTE — Discharge Instructions (Signed)
Follow up with orthopedics sometime this week.

## 2021-12-22 ENCOUNTER — Ambulatory Visit
Admission: RE | Admit: 2021-12-22 | Disposition: A | Discharge status: Still In Hospital | Payer: BC Managed Care – PPO | Source: Ambulatory Visit

## 2021-12-22 ENCOUNTER — Ambulatory Visit (INDEPENDENT_AMBULATORY_CARE_PROVIDER_SITE_OTHER): Payer: BC Managed Care – PPO

## 2021-12-22 ENCOUNTER — Ambulatory Visit
Admission: EM | Admit: 2021-12-22 | Discharge: 2021-12-22 | Disposition: A | Discharge status: Home / Self Care | Payer: BC Managed Care – PPO | Source: Home / Self Care

## 2021-12-22 ENCOUNTER — Encounter: Payer: Self-pay | Admitting: Emergency Medicine

## 2021-12-22 DIAGNOSIS — Z23 Encounter for immunization: Secondary | ICD-10-CM | POA: Diagnosis not present

## 2021-12-22 DIAGNOSIS — W540XXA Bitten by dog, initial encounter: Secondary | ICD-10-CM

## 2021-12-22 DIAGNOSIS — S81852A Open bite, left lower leg, initial encounter: Secondary | ICD-10-CM | POA: Diagnosis not present

## 2021-12-22 DIAGNOSIS — S61451A Open bite of right hand, initial encounter: Secondary | ICD-10-CM | POA: Diagnosis not present

## 2021-12-22 DIAGNOSIS — W548XXA Other contact with dog, initial encounter: Secondary | ICD-10-CM | POA: Diagnosis not present

## 2021-12-22 DIAGNOSIS — S61459A Open bite of unspecified hand, initial encounter: Secondary | ICD-10-CM

## 2021-12-22 DIAGNOSIS — L089 Local infection of the skin and subcutaneous tissue, unspecified: Secondary | ICD-10-CM

## 2021-12-22 MED ORDER — AMOXICILLIN-POT CLAVULANATE 875-125 MG PO TABS: 1.0000 | ORAL_TABLET | Freq: Two times a day (BID) | ORAL | 0 refills | Status: DC

## 2021-12-22 MED ORDER — FLUCONAZOLE 150 MG PO TABS: 150.0000 mg | ORAL_TABLET | ORAL | 0 refills | Status: DC | PRN

## 2021-12-22 MED ORDER — MUPIROCIN 2 % EX OINT: 1.0000 | TOPICAL_OINTMENT | Freq: Two times a day (BID) | CUTANEOUS | 0 refills | Status: DC

## 2021-12-22 MED ORDER — TETANUS-DIPHTH-ACELL PERTUSSIS 5-2.5-18.5 LF-MCG/0.5 IM SUSY
0.5000 mL | PREFILLED_SYRINGE | Freq: Once | INTRAMUSCULAR | Status: AC
  Administered 2021-12-22: 0.5 mL via INTRAMUSCULAR

## 2021-12-22 NOTE — ED Provider Notes (Signed)
[facility]    CSN: [id number] Arrival date & time: 12/22/21  1026      History   Chief Complaint Chief Complaint  Patient presents with   Head Injury    Dog bite pain is worse on my way now - Entered by patient   Animal Bite    HPI Megan Pacheco is a 36 y.o. female.   HPI  Patient presents for evaluation of right hand wound, left posterior lower leg puncture wound, and multiple scratches on her left legs and right arm which occurred this morning.  Patient reports her dog's were fighting and she attempted to remove herself away from them and both attacked her by scratching and biting her.  She reports her dogs are all up-to-date on all of her vaccines.  She has some swelling involving her right hand distal to her fifth finger.  The bite on the right hand occurred on the palmar surface of the hand.  She also has a deep puncture wound on the left posterior lower leg.  Bleeding is currently well controlled.  She reports cleansing the wounds immediately after the bite occurred.   History reviewed. No pertinent past medical history.  Patient Active Problem List   Diagnosis Date Noted   Vomiting 12/14/2017    Past Surgical History:  Procedure Laterality Date   PANCREAS SURGERY      OB History   No obstetric history on file.      Home Medications    Prior to Admission medications   Medication Sig Start Date End Date Taking? Authorizing Provider  acidophilus (RISAQUAD) CAPS capsule Take 1 capsule by mouth daily.    [provider]  albuterol (VENTOLIN HFA) 108 (90 Base) MCG/ACT inhaler Inhale 2 puffs into the lungs every 4 (four) hours as needed for wheezing or shortness of breath.     [provider]  aspirin EC 81 MG tablet Take 81 mg by mouth daily.    [provider]  BELSOMRA 5 MG TABS Take 1 tablet by mouth at bedtime. 10/22/21   [provider]  ciprofloxacin (CIPRO) 500 MG tablet Take 1 tablet (500 mg total) by mouth 2 (two)  times daily. 12/16/17   Demetrios Loll, MD  clonazePAM (KLONOPIN) 0.5 MG tablet Take 0.5 mg by mouth 2 (two) times daily as needed. 10/19/21   [provider]  Desvenlafaxine Fumarate ER 100 MG TB24 Take 100 mg by mouth daily.    [provider]  diphenhydrAMINE HCl (BENADRYL ALLERGY PO) Take by mouth.    [provider]  escitalopram (LEXAPRO) 10 MG tablet Take 10 mg by mouth daily. 11/03/21   [provider]  escitalopram (LEXAPRO) 20 MG tablet Take 20 mg by mouth daily. 11/03/21   [provider]  esomeprazole (NEXIUM) 40 MG capsule Take 40 mg by mouth daily. 02/01/08   [provider]  fexofenadine (ALLEGRA) 180 MG tablet Take 180 mg by mouth daily as needed for allergies.  10/17/08   [provider]  loratadine-pseudoephedrine (CLARITIN-D 12 HOUR) 5-120 MG tablet Take 1 tablet by mouth 2 (two) times daily.    [provider]  mirtazapine (REMERON) 15 MG tablet Take 7.5 mg by mouth at bedtime. 11/28/17 12/28/17  [provider]  Multiple Vitamin (MULTI-VITAMINS) TABS Take 1 tablet by mouth daily.    [provider]  ondansetron (ZOFRAN) 8 MG tablet Take 1 tablet (8 mg total) by mouth every 8 (eight) hours as needed for nausea  or vomiting. 10/05/18   Kennyth Arnold, FNP  STRATTERA 25 MG capsule Take by mouth. 11/03/21   [provider]  traMADol (ULTRAM) 50 MG tablet Take 50-100 mg by mouth every 8 (eight) hours as needed for moderate pain.  12/09/17   [provider]  traZODone (DESYREL) 50 MG tablet Take 50 mg by mouth at bedtime. 10/30/21   [provider]  TRI-ESTARYLLA 0.18/0.215/0.25 MG-35 MCG tablet Take 1 tablet by mouth daily. 10/06/21   [provider]    Family History History reviewed. No pertinent family history.  Social History Social History   Tobacco Use   Smoking status: Never   Smokeless tobacco: Never  Vaping Use   Vaping Use: Never used  Substance Use Topics    Alcohol use: Not Currently   Drug use: Never     Allergies   Uncaria tomentosa (cats claw)   Review of Systems Review of Systems Pertinent negatives listed in HPI   Physical Exam Triage Vital Signs ED Triage Vitals  Enc Vitals Group     BP 12/22/21 1039 115/78     Pulse Rate 12/22/21 1039 87     Resp 12/22/21 1039 16     Temp 12/22/21 1039 98.6 F (37 C)     Temp Source 12/22/21 1039 Oral     SpO2 12/22/21 1039 96 %     Weight --      Height --      Head Circumference --      Peak Flow --      Pain Score 12/22/21 1042 8     Pain Loc --      Pain Edu? --      Excl. in [place]? --    No data found.  Updated Vital Signs BP 115/78 (BP Location: Left Arm)   Pulse 87   Temp 98.6 F (37 C) (Oral)   Resp 16   SpO2 96%   Visual Acuity Right Eye Distance:   Left Eye Distance:   Bilateral Distance:    Right Eye Near:   Left Eye Near:    Bilateral Near:     Physical Exam Constitutional:      Appearance: Normal appearance.  HENT:     Head: Normocephalic and atraumatic.  Eyes:     Extraocular Movements: Extraocular movements intact.     Pupils: Pupils are equal, round, and reactive to light.  Cardiovascular:     Rate and Rhythm: Normal rate and regular rhythm.  Pulmonary:     Effort: Pulmonary effort is normal.     Breath sounds: Normal breath sounds.  Skin:    General: Skin is warm and dry.     Capillary Refill: Capillary refill takes less than 2 seconds.     Comments: Laceration right hand, palmar surface distal to 5th digit (4.5 cm, depth 1/2 mm) Puncture wound left calf (2.5 cm , 1/2 mm depth)  Neurological:     General: No focal deficit present.     Mental Status: She is alert and oriented to person, place, and time.  Psychiatric:        Mood and Affect: Mood normal.        Behavior: Behavior normal.        Thought Content: Thought content normal.        Judgment: Judgment normal.    UC Treatments / Results  Labs (all labs ordered are listed, but  only abnormal results are displayed) Labs Reviewed - No data  to display  EKG   Radiology No results found.  Procedures Procedures (including critical care time)  Medications Ordered in UC Medications - No data to display  Initial Impression / Assessment and Plan / UC Course  I have reviewed the triage vital signs and the nursing notes.  Pertinent labs & imaging results that were available during my care of the patient were reviewed by me and considered in my medical decision making (see chart for details).    Imaging of the right hand unremarkable.  No avulsion fracture noted on imaging.  Wound care instructions provided.  Advised patients to take all of prescribed antibiotic.  Diflucan prescribed for prophylaxis against yeast.  Advised to monitor for signs of worsening infection and if the symptoms do develop to go immediately to the emergency department. Patient's tetanus was updated today.  Return as needed. Final Clinical Impressions(s) / UC Diagnoses   Final diagnoses:  Dog bite of right hand, initial encounter  Dog bite of left lower leg, initial encounter  Dog scratch  Infected animal bite of hand, initial encounter   Discharge Instructions   None    ED Prescriptions     Medication Sig Dispense Auth. Provider   amoxicillin-clavulanate (AUGMENTIN) 875-125 MG tablet Take 1 tablet by mouth 2 (two) times daily. 20 tablet Scot Jun, FNP   mupirocin ointment (BACTROBAN) 2 % Apply 1 Application topically 2 (two) times daily. 30 g Scot Jun, FNP   fluconazole (DIFLUCAN) 150 MG tablet Take 1 tablet (150 mg total) by mouth every three (3) days as needed. Repeat if needed 2 tablet Scot Jun, FNP      PDMP not reviewed this encounter.   Scot Jun, FNP 12/22/21 1135

## 2021-12-22 NOTE — ED Triage Notes (Signed)
Pt presents with a dog bite to her right hand, left thigh and left upper arm today by her dogs. She states they are up to date with their vaccines and her last tetanus was 2019.

## 2021-12-22 NOTE — Discharge Instructions (Addendum)
X-ray of hand is negative for fracture. Start antibiotic medication today.  Change dressings at least twice daily.  Monitor for signs of worsening infection such as pus drainage, swelling, redness, fever or nausea with vomiting, if any these "Red Flag" symptoms develop, go to the nearest emergency department as you may require IV antibiotics.

## 2021-12-25 ENCOUNTER — Encounter: Payer: Self-pay | Admitting: Physician Assistant

## 2021-12-25 ENCOUNTER — Ambulatory Visit (INDEPENDENT_AMBULATORY_CARE_PROVIDER_SITE_OTHER): Payer: BC Managed Care – PPO | Admitting: Physician Assistant

## 2021-12-25 VITALS — BP 110/74 | HR 102 | Temp 98.4°F | Resp 16 | Ht 61.5 in | Wt 157.8 lb

## 2021-12-25 DIAGNOSIS — E663 Overweight: Secondary | ICD-10-CM

## 2021-12-25 DIAGNOSIS — F4310 Post-traumatic stress disorder, unspecified: Secondary | ICD-10-CM

## 2021-12-25 DIAGNOSIS — R631 Polydipsia: Secondary | ICD-10-CM

## 2021-12-25 DIAGNOSIS — J3081 Allergic rhinitis due to animal (cat) (dog) hair and dander: Secondary | ICD-10-CM | POA: Insufficient documentation

## 2021-12-25 DIAGNOSIS — F339 Major depressive disorder, recurrent, unspecified: Secondary | ICD-10-CM | POA: Insufficient documentation

## 2021-12-25 DIAGNOSIS — F5101 Primary insomnia: Secondary | ICD-10-CM

## 2021-12-25 DIAGNOSIS — W540XXD Bitten by dog, subsequent encounter: Secondary | ICD-10-CM

## 2021-12-25 DIAGNOSIS — Z7689 Persons encountering health services in other specified circumstances: Secondary | ICD-10-CM | POA: Insufficient documentation

## 2021-12-25 DIAGNOSIS — R5382 Chronic fatigue, unspecified: Secondary | ICD-10-CM

## 2021-12-25 DIAGNOSIS — F902 Attention-deficit hyperactivity disorder, combined type: Secondary | ICD-10-CM

## 2021-12-25 DIAGNOSIS — F419 Anxiety disorder, unspecified: Secondary | ICD-10-CM

## 2021-12-25 NOTE — Progress Notes (Addendum)
I,Boyd Litaker Robinson,acting as a Education administrator for [employer], PA-C.,have documented all relevant documentation on the behalf of Megan Speak, PA-C,as directed by  [employer], PA-C while in the presence of [employer], PA-C.  New Patient Office Visit  Subjective    Patient ID: Megan Pacheco, female    DOB: 12/15/1985  Age: 36 y.o. MRN: [id number]  CC:  Chief Complaint  Patient presents with   Establish Care  hand pain / wound   HPI Megan Pacheco is a 36 yr old female presenting to establish care.  Patient wants to talk about her medications.  States some are working and some are not.  Also would like her hand looked at from recent ER visit 12-22-21.  Patient got in the middle of her dogs fighting. Currently on antibiotic. ER report in chart.     Outpatient Encounter Medications as of 12/25/2021  Medication Sig   albuterol (VENTOLIN HFA) 108 (90 Base) MCG/ACT inhaler Inhale 2 puffs into the lungs every 4 (four) hours as needed for wheezing or shortness of breath.    amoxicillin-clavulanate (AUGMENTIN) 875-125 MG tablet Take 1 tablet by mouth 2 (two) times daily.   BELSOMRA 5 MG TABS Take 1 tablet by mouth at bedtime.   clonazePAM (KLONOPIN) 0.5 MG tablet Take 0.5 mg by mouth 2 (two) times daily as needed.   diphenhydrAMINE HCl (BENADRYL ALLERGY PO) Take by mouth.   escitalopram (LEXAPRO) 10 MG tablet Take 10 mg by mouth daily.   escitalopram (LEXAPRO) 20 MG tablet Take 20 mg by mouth daily.   fluconazole (DIFLUCAN) 150 MG tablet Take 1 tablet (150 mg total) by mouth every three (3) days as needed. Repeat if needed   loratadine-pseudoephedrine (CLARITIN-D 12 HOUR) 5-120 MG tablet Take 1 tablet by mouth 2 (two) times daily.   mupirocin ointment (BACTROBAN) 2 % Apply 1 Application topically 2 (two) times daily.   ondansetron (ZOFRAN) 8 MG tablet Take 1 tablet (8 mg total) by mouth every 8 (eight) hours as needed for nausea or vomiting.   STRATTERA 25 MG capsule Take by mouth.   traZODone  (DESYREL) 50 MG tablet Take 50 mg by mouth at bedtime.   TRI-ESTARYLLA 0.18/0.215/0.25 MG-35 MCG tablet Take 1 tablet by mouth daily.   acidophilus (RISAQUAD) CAPS capsule Take 1 capsule by mouth daily.   aspirin EC 81 MG tablet Take 81 mg by mouth daily.   ciprofloxacin (CIPRO) 500 MG tablet Take 1 tablet (500 mg total) by mouth 2 (two) times daily.   Desvenlafaxine Fumarate ER 100 MG TB24 Take 100 mg by mouth daily.   esomeprazole (NEXIUM) 40 MG capsule Take 40 mg by mouth daily.   fexofenadine (ALLEGRA) 180 MG tablet Take 180 mg by mouth daily as needed for allergies.  (Patient not taking: Reported on 12/25/2021)   mirtazapine (REMERON) 15 MG tablet Take 7.5 mg by mouth at bedtime.   Multiple Vitamin (MULTI-VITAMINS) TABS Take 1 tablet by mouth daily. (Patient not taking: Reported on 12/25/2021)   traMADol (ULTRAM) 50 MG tablet Take 50-100 mg by mouth every 8 (eight) hours as needed for moderate pain.    No facility-administered encounter medications on file as of 12/25/2021.    Past Medical History:  Diagnosis Date   ADHD    Fatigue    Insomnia    PTSD (post-traumatic stress disorder)    Vitamin D deficiency     Past Surgical History:  Procedure Laterality Date   PANCREAS SURGERY     spleen  ectomy      Family History  Problem Relation Age of Onset   Diabetes Father    Hyperthyroidism Father    Heart disease Father    Post-traumatic stress disorder Brother    Liver cancer Maternal Grandmother    Diabetes Paternal Grandfather     Social History   Socioeconomic History   Marital status: Significant Other    Spouse name: Not on file   Number of children: Not on file   Years of education: Not on file   Highest education level: Not on file  Occupational History   Not on file  Tobacco Use   Smoking status: Never   Smokeless tobacco: Never  Vaping Use   Vaping Use: Never used  Substance and Sexual Activity   Alcohol use: Not Currently   Drug use: Never   Sexual  activity: Yes    Birth control/protection: Pill    Comment: LMP 12/06/21  Other Topics Concern   Not on file  Social History Narrative   Not on file   Social Determinants of Health   Financial Resource Strain: Not on file  Food Insecurity: Not on file  Transportation Needs: Not on file  Physical Activity: Not on file  Stress: Not on file  Social Connections: Not on file  Intimate Partner Violence: Not on file    Review of Systems  Constitutional:  Positive for malaise/fatigue.  Gastrointestinal:  Positive for constipation.  Neurological:  Positive for headaches.  Endo/Heme/Allergies:  Positive for environmental allergies and polydipsia.  Psychiatric/Behavioral:  The patient is nervous/anxious and has insomnia.   All other systems reviewed and are negative.       Objective    BP 110/74 (BP Location: Right Arm, Patient Position: Sitting, Cuff Size: Normal)   Pulse (!) 102   Temp 98.4 F (36.9 C) (Oral)   Resp 16   Ht 5' 1.5" (1.562 m)   Wt 157 lb 12.8 oz (71.6 kg)   LMP 12/18/2021   SpO2 97%   BMI 29.33 kg/m   Physical Exam Vitals reviewed.  Constitutional:      General: She is not in acute distress.    Appearance: Normal appearance. She is well-developed. She is not diaphoretic.  HENT:     Head: Normocephalic and atraumatic.     Nose: Nose normal.     Mouth/Throat:     Pharynx: Oropharynx is clear. No oropharyngeal exudate.  Eyes:     General: No scleral icterus.    Extraocular Movements: Extraocular movements intact.     Conjunctiva/sclera: Conjunctivae normal.     Pupils: Pupils are equal, round, and reactive to light.  Neck:     Thyroid: No thyromegaly.  Cardiovascular:     Rate and Rhythm: Normal rate and regular rhythm.     Pulses: Normal pulses.     Heart sounds: Normal heart sounds. No murmur heard. Pulmonary:     Effort: Pulmonary effort is normal. No respiratory distress.     Breath sounds: Normal breath sounds. No wheezing or rales.   Abdominal:     General: Abdomen is flat. Bowel sounds are normal. There is no distension.     Palpations: Abdomen is soft.     Tenderness: There is no abdominal tenderness.  Musculoskeletal:        General: No swelling, tenderness, deformity or signs of injury. Normal range of motion.     Cervical back: Neck supple.     Right lower leg: No edema.  Left lower leg: No edema.  Lymphadenopathy:     Cervical: No cervical adenopathy.  Skin:    General: Skin is warm and dry.     Capillary Refill: Capillary refill takes less than 2 seconds.     Coloration: Skin is not jaundiced or pale.     Findings: Bruising, erythema and lesion present. No rash.     Comments: Laceration right hand, palmar surface distal to 5th digit (4.5 cm, depth 1/2 mm) Puncture wound left calf (2.5 cm , 1/2 mm depth)   Neurological:     Mental Status: She is alert and oriented to person, place, and time. Mental status is at baseline.     Sensory: No sensory deficit.     Motor: No weakness.     Coordination: Coordination normal.     Gait: Gait normal.     Deep Tendon Reflexes: Reflexes normal.  Psychiatric:        Behavior: Behavior normal.        Thought Content: Thought content normal.        Judgment: Judgment normal.         Assessment & Plan:   Problem List Items Addressed This Visit   None Visit Diagnoses     Primary insomnia    -  Primary   Relevant Orders   Ambulatory referral to Psychiatry   PTSD (post-traumatic stress disorder)       Relevant Orders   Ambulatory referral to Psychiatry   Anxiety       Relevant Orders   Ambulatory referral to Psychiatry   Depression, recurrent ([place])       Relevant Orders   Ambulatory referral to Psychiatry   Attention deficit hyperactivity disorder (ADHD), combined type       Relevant Orders   Ambulatory referral to Psychiatry   Allergic rhinitis due to animal hair and dander       Polydipsia       Relevant Orders   Comprehensive metabolic panel    CBC with Differential/Platelet   Chronic fatigue       Relevant Orders   Comprehensive metabolic panel   CBC with Differential/Platelet   Overweight (BMI 25.0-29.9)       Relevant Orders   Comprehensive metabolic panel   CBC with Differential/Platelet   Encounter to establish care         1. Primary insomnia Needs some medications adjustments - Ambulatory referral to Psychiatry  2. PTSD (post-traumatic stress disorder) Needs some medication adjustments - Ambulatory referral to Psychiatry  3. Anxiety Needs some medication adjustments - Ambulatory referral to Psychiatry  4. Depression, recurrent ([place]) PHQ 9 is 11, moderate depression Advised to contact [facility] - Ambulatory referral to Psychiatry  5. Attention deficit hyperactivity disorder (ADHD), combined type Chronic. Stable. Prefers to change her medication. - Ambulatory referral to Psychiatry  6. Allergic rhinitis due to animal fur and dander Chronic. Stable.  7. Polydipsia  - Comprehensive metabolic panel - CBC with Differential/Platelet  8. Chronic fatigue  - Comprehensive metabolic panel - CBC with Differential/Platelet  9. Overweight (BMI 25.0-29.9)  - Comprehensive metabolic panel - CBC with Differential/Platelet Lifestyle modifications via healthy diet and regular exercise suggested.  10.  Establish care  11. Dog bite Continue a course of antibiotics Keep the wounds clean and dry Uptodate with tetanus shot/was administered yesterday at ED  The patient was advised to call back or seek an in-person evaluation if the symptoms worsen or if the condition fails to  improve as anticipated.  I discussed the assessment and treatment plan with the patient. The patient was provided an opportunity to ask questions and all were answered. The patient agreed with the plan and demonstrated an understanding of the instructions.  The entirety of the information documented in the History of  Present Illness, Review of Systems and Physical Exam were personally obtained by me. Portions of this information were initially documented by the CMA and reviewed by me for thoroughness and accuracy.  Portions of this note were created using dictation software and may contain typographical errors.       Total encounter time more than 45 minutes  Greater than 50% was spent in counseling and coordination of care with the patient  Megan Pacheco, [facility], [place] [phone] (phone) [phone] (fax)

## 2021-12-26 LAB — COMPREHENSIVE METABOLIC PANEL
ALT: 33 IU/L — ABNORMAL HIGH (ref 0–32)
AST: 25 IU/L (ref 0–40)
Albumin/Globulin Ratio: 1.6 (ref 1.2–2.2)
Albumin: 4.4 g/dL (ref 3.8–4.8)
Alkaline Phosphatase: 106 IU/L (ref 44–121)
BUN/Creatinine Ratio: 11 (ref 9–23)
BUN: 9 mg/dL (ref 6–20)
Bilirubin Total: <0.2 mg/dL (ref 0.0–1.2)
CO2: 23 mmol/L (ref 20–29)
Calcium: 9.6 mg/dL (ref 8.7–10.2)
Chloride: 100 mmol/L (ref 96–106)
Creatinine, Ser: 0.83 mg/dL (ref 0.57–1.00)
Globulin, Total: 2.7 g/dL (ref 1.5–4.5)
Glucose: 111 mg/dL — ABNORMAL HIGH (ref 70–99)
Potassium: 4 mmol/L (ref 3.5–5.2)
Sodium: 138 mmol/L (ref 134–144)
Total Protein: 7.1 g/dL (ref 6.0–8.5)
eGFR: 95 mL/min/{1.73_m2} (ref >59)

## 2021-12-26 LAB — CBC WITH DIFFERENTIAL/PLATELET
Basophils Absolute: 0.1 10*3/uL (ref 0.0–0.2)
Basos: 1 %
EOS (ABSOLUTE): 0.3 10*3/uL (ref 0.0–0.4)
Eos: 3 %
Hematocrit: 38.7 % (ref 34.0–46.6)
Hemoglobin: 12.9 g/dL (ref 11.1–15.9)
Immature Grans (Abs): 0 10*3/uL (ref 0.0–0.1)
Immature Granulocytes: 0 %
Lymphocytes Absolute: 3.8 10*3/uL — ABNORMAL HIGH (ref 0.7–3.1)
Lymphs: 35 %
MCH: 31.7 pg (ref 26.6–33.0)
MCHC: 33.3 g/dL (ref 31.5–35.7)
MCV: 95 fL (ref 79–97)
Monocytes Absolute: 1 10*3/uL — ABNORMAL HIGH (ref 0.1–0.9)
Monocytes: 9 %
Neutrophils Absolute: 5.7 10*3/uL (ref 1.4–7.0)
Neutrophils: 52 %
Platelets: 481 10*3/uL — ABNORMAL HIGH (ref 150–450)
RBC: 4.07 x10E6/uL (ref 3.77–5.28)
RDW: 12.2 % (ref 11.7–15.4)
WBC: 10.9 10*3/uL — ABNORMAL HIGH (ref 3.4–10.8)

## 2021-12-29 NOTE — Progress Notes (Signed)
Hello Megan Pacheco ,   Your labwork results all are within normal limits./stable for you. You have a slight increase in liver enzyme - weight loss of 5 % of your current weight is recommended via healthy low-carb, low-fat diet and regular exercise You do have thrombocytosis, increased platelets level. It could be due to viral infection or anemia. CBC could be rechecked at your next visit.  Please, schedule a follow up visit.  No changes need to be made to medications, and no further tests need to be ordered.  Any questions please reach out to the office or message me on MyChart!  Best, Mardene Speak, PA-C

## 2022-01-27 ENCOUNTER — Ambulatory Visit: Payer: BC Managed Care – PPO | Admitting: Physician Assistant

## 2022-01-27 NOTE — Progress Notes (Deleted)
I,Jana Kindra Bickham,acting as a Education administrator for [employer], PA-C.,have documented all relevant documentation on the behalf of Mardene Speak, PA-C,as directed by  [employer], PA-C while in the presence of [employer], PA-C.   Established patient visit   Patient: Megan Pacheco   DOB: 09-24-1985   36 y.o. Female  MRN: [id number] Visit Date: 01/27/2022  Today's healthcare provider: Mardene Speak, PA-C   No chief complaint on file.  Subjective    Patient presents today reporting blood sugar levels are low for her. Reports light headedness, nauseous, and slight chest tightness.   ----------------------------------------------------------------------------------------- Follow up for thrombocytosis      The patient was last seen for this 1 months ago. Slight increase in liver enzyme       Increased platelets level. It could be due to viral infection or anemia. CBC could be rechecked at your next visit.     Medications: Outpatient Medications Prior to Visit  Medication Sig   acidophilus (RISAQUAD) CAPS capsule Take 1 capsule by mouth daily.   albuterol (VENTOLIN HFA) 108 (90 Base) MCG/ACT inhaler Inhale 2 puffs into the lungs every 4 (four) hours as needed for wheezing or shortness of breath.    amoxicillin-clavulanate (AUGMENTIN) 875-125 MG tablet Take 1 tablet by mouth 2 (two) times daily.   aspirin EC 81 MG tablet Take 81 mg by mouth daily.   BELSOMRA 5 MG TABS Take 1 tablet by mouth at bedtime.   ciprofloxacin (CIPRO) 500 MG tablet Take 1 tablet (500 mg total) by mouth 2 (two) times daily.   clonazePAM (KLONOPIN) 0.5 MG tablet Take 0.5 mg by mouth 2 (two) times daily as needed.   Desvenlafaxine Fumarate ER 100 MG TB24 Take 100 mg by mouth daily.   diphenhydrAMINE HCl (BENADRYL ALLERGY PO) Take by mouth.   escitalopram (LEXAPRO) 10 MG tablet Take 10 mg by mouth daily.   escitalopram (LEXAPRO) 20 MG tablet Take 20 mg by mouth daily.   esomeprazole (NEXIUM) 40 MG capsule Take 40  mg by mouth daily.   fluconazole (DIFLUCAN) 150 MG tablet Take 1 tablet (150 mg total) by mouth every three (3) days as needed. Repeat if needed   loratadine-pseudoephedrine (CLARITIN-D 12 HOUR) 5-120 MG tablet Take 1 tablet by mouth 2 (two) times daily.   mirtazapine (REMERON) 15 MG tablet Take 7.5 mg by mouth at bedtime.   mupirocin ointment (BACTROBAN) 2 % Apply 1 Application topically 2 (two) times daily.   ondansetron (ZOFRAN) 8 MG tablet Take 1 tablet (8 mg total) by mouth every 8 (eight) hours as needed for nausea or vomiting.   STRATTERA 25 MG capsule Take by mouth.   traMADol (ULTRAM) 50 MG tablet Take 50-100 mg by mouth every 8 (eight) hours as needed for moderate pain.    traZODone (DESYREL) 50 MG tablet Take 50 mg by mouth at bedtime.   TRI-ESTARYLLA 0.18/0.215/0.25 MG-35 MCG tablet Take 1 tablet by mouth daily.   No facility-administered medications prior to visit.    Review of Systems  {Labs  Heme  Chem  Endocrine  Serology  Results Review (optional):23779}   Objective    There were no vitals taken for this visit. {Show previous vital signs (optional):23777}  Physical Exam  ***  No results found for any visits on 01/27/22.  Assessment & Plan     ***  No follow-ups on file.      {provider attestation***:1}   Mardene Speak, Hershal Coria  [facility] [phone] (phone) [phone] (fax)  [place]

## 2022-01-29 ENCOUNTER — Encounter: Payer: Self-pay | Admitting: Physician Assistant

## 2022-01-29 ENCOUNTER — Ambulatory Visit: Payer: BC Managed Care – PPO | Admitting: Physician Assistant

## 2022-01-29 VITALS — BP 122/82 | HR 91 | Temp 98.2°F | Resp 16 | Wt 157.6 lb

## 2022-01-29 DIAGNOSIS — E161 Other hypoglycemia: Secondary | ICD-10-CM | POA: Diagnosis not present

## 2022-01-29 DIAGNOSIS — R7989 Other specified abnormal findings of blood chemistry: Secondary | ICD-10-CM

## 2022-01-29 DIAGNOSIS — G43009 Migraine without aura, not intractable, without status migrainosus: Secondary | ICD-10-CM | POA: Diagnosis not present

## 2022-01-29 LAB — GLUCOSE, POCT (MANUAL RESULT ENTRY): POC Glucose: 117 mg/dl — AB (ref 70–99)

## 2022-01-29 NOTE — Progress Notes (Unsigned)
I,Venita Seng Robinson,acting as a Education administrator for [employer], PA-C.,have documented all relevant documentation on the behalf of Mardene Speak, PA-C,as directed by  [employer], PA-C while in the presence of [employer], PA-C.   Established patient visit   Patient: Megan Pacheco   DOB: May 16, 1986   36 y.o. Female  MRN: [id number] Visit Date: 01/29/2022  Today's healthcare provider: Mardene Speak, PA-C   Chief Complaint  Patient presents with   Blood Sugar Problem   Subjective     Patient presents reporting blood sugar levels are staying extremely low, lightheadedness, nauseous, and slight chest tightness x past couple weeks. This morning blood sugar 110 fasting. Reports she has 40% of her pancrease so she likes to check her sugar.  Feeling weak and tired today.  Also having migraines everyday for the past x 2 weeks.    Medications: Outpatient Medications Prior to Visit  Medication Sig   acidophilus (RISAQUAD) CAPS capsule Take 1 capsule by mouth daily.   albuterol (VENTOLIN HFA) 108 (90 Base) MCG/ACT inhaler Inhale 2 puffs into the lungs every 4 (four) hours as needed for wheezing or shortness of breath.    amoxicillin-clavulanate (AUGMENTIN) 875-125 MG tablet Take 1 tablet by mouth 2 (two) times daily.   aspirin EC 81 MG tablet Take 81 mg by mouth daily.   BELSOMRA 5 MG TABS Take 1 tablet by mouth at bedtime.   ciprofloxacin (CIPRO) 500 MG tablet Take 1 tablet (500 mg total) by mouth 2 (two) times daily.   clonazePAM (KLONOPIN) 0.5 MG tablet Take 0.5 mg by mouth 2 (two) times daily as needed.   diphenhydrAMINE HCl (BENADRYL ALLERGY PO) Take by mouth.   escitalopram (LEXAPRO) 10 MG tablet Take 10 mg by mouth daily.   escitalopram (LEXAPRO) 20 MG tablet Take 20 mg by mouth daily.   esomeprazole (NEXIUM) 40 MG capsule Take 40 mg by mouth daily.   fluconazole (DIFLUCAN) 150 MG tablet Take 1 tablet (150 mg total) by mouth every three (3) days as needed. Repeat if needed    loratadine-pseudoephedrine (CLARITIN-D 12 HOUR) 5-120 MG tablet Take 1 tablet by mouth 2 (two) times daily.   mupirocin ointment (BACTROBAN) 2 % Apply 1 Application topically 2 (two) times daily.   STRATTERA 25 MG capsule Take by mouth.   traZODone (DESYREL) 50 MG tablet Take 50 mg by mouth at bedtime.   TRI-ESTARYLLA 0.18/0.215/0.25 MG-35 MCG tablet Take 1 tablet by mouth daily.   Desvenlafaxine Fumarate ER 100 MG TB24 Take 100 mg by mouth daily. (Patient not taking: Reported on 01/29/2022)   mirtazapine (REMERON) 15 MG tablet Take 7.5 mg by mouth at bedtime.   ondansetron (ZOFRAN) 8 MG tablet Take 1 tablet (8 mg total) by mouth every 8 (eight) hours as needed for nausea or vomiting. (Patient not taking: Reported on 01/29/2022)   traMADol (ULTRAM) 50 MG tablet Take 50-100 mg by mouth every 8 (eight) hours as needed for moderate pain.  (Patient not taking: Reported on 01/29/2022)   No facility-administered medications prior to visit.    Review of Systems  {Labs  Heme  Chem  Endocrine  Serology  Results Review (optional):23779}   Objective    BP 122/82 (BP Location: Right Arm, Patient Position: Sitting, Cuff Size: Normal)   Pulse 91   Temp 98.2 F (36.8 C) (Oral)   Resp 16   Wt 157 lb 9.6 oz (71.5 kg)   SpO2 100%   BMI 29.30 kg/m  {Show previous vital signs (  optional):23777}  Physical Exam Vitals reviewed.  Constitutional:      General: She is not in acute distress.    Appearance: Normal appearance. She is well-developed. She is not diaphoretic.  HENT:     Head: Normocephalic and atraumatic.  Eyes:     General: No scleral icterus.    Conjunctiva/sclera: Conjunctivae normal.  Neck:     Thyroid: No thyromegaly.  Cardiovascular:     Rate and Rhythm: Normal rate and regular rhythm.     Pulses: Normal pulses.     Heart sounds: Normal heart sounds. No murmur heard. Pulmonary:     Effort: Pulmonary effort is normal. No respiratory distress.     Breath sounds: Normal breath  sounds. No wheezing, rhonchi or rales.  Musculoskeletal:     Cervical back: Neck supple.     Right lower leg: No edema.     Left lower leg: No edema.  Lymphadenopathy:     Cervical: No cervical adenopathy.  Skin:    General: Skin is warm and dry.     Findings: No rash.  Neurological:     Mental Status: She is alert and oriented to person, place, and time. Mental status is at baseline.  Psychiatric:        Mood and Affect: Mood normal.        Behavior: Behavior normal.        Thought Content: Thought content normal.        Judgment: Judgment normal.     No results found for any visits on 01/29/22.  Assessment & Plan     1. Fasting low blood sugar  - POCT glucose (manual entry) - CBC w/Diff/Platelet Encouraged healthy dieting and daily exercise  2. Abnormal CBC Increased WBCs,   3. Migraine without aura and without status migrainosus, not intractable Symptomatic treatment. Could be due to low sugar or hormonal disbalance?  FU after appt with [facility]     The patient was advised to call back or seek an in-person evaluation if the symptoms worsen or if the condition fails to improve as anticipated.  I discussed the assessment and treatment plan with the patient. The patient was provided an opportunity to ask questions and all were answered. The patient agreed with the plan and demonstrated an understanding of the instructions.  The entirety of the information documented in the History of Present Illness, Review of Systems and Physical Exam were personally obtained by me. Portions of this information were initially documented by the CMA and reviewed by me for thoroughness and accuracy.  Portions of this note were created using dictation software and may contain typographical errors.   Mardene Speak, PA-C  [facility] [phone] (phone) [phone] (fax)  [place]

## 2022-01-30 LAB — CBC WITH DIFFERENTIAL/PLATELET
Basophils Absolute: 0.1 10*3/uL (ref 0.0–0.2)
Basos: 1 %
EOS (ABSOLUTE): 0.5 10*3/uL — ABNORMAL HIGH (ref 0.0–0.4)
Eos: 5 %
Hematocrit: 37.6 % (ref 34.0–46.6)
Hemoglobin: 12.6 g/dL (ref 11.1–15.9)
Immature Grans (Abs): 0 10*3/uL (ref 0.0–0.1)
Immature Granulocytes: 0 %
Lymphocytes Absolute: 4.1 10*3/uL — ABNORMAL HIGH (ref 0.7–3.1)
Lymphs: 40 %
MCH: 32.1 pg (ref 26.6–33.0)
MCHC: 33.5 g/dL (ref 31.5–35.7)
MCV: 96 fL (ref 79–97)
Monocytes Absolute: 0.9 10*3/uL (ref 0.1–0.9)
Monocytes: 9 %
Neutrophils Absolute: 4.7 10*3/uL (ref 1.4–7.0)
Neutrophils: 45 %
Platelets: 445 10*3/uL (ref 150–450)
RBC: 3.93 x10E6/uL (ref 3.77–5.28)
RDW: 12.8 % (ref 11.7–15.4)
WBC: 10.4 10*3/uL (ref 3.4–10.8)

## 2022-02-05 ENCOUNTER — Encounter: Payer: Self-pay | Admitting: Physician Assistant

## 2022-02-25 ENCOUNTER — Encounter (HOSPITAL_COMMUNITY): Payer: Self-pay

## 2022-02-25 ENCOUNTER — Ambulatory Visit (HOSPITAL_COMMUNITY): Payer: BC Managed Care – PPO | Admitting: Psychiatry

## 2022-03-01 ENCOUNTER — Ambulatory Visit: Payer: BC Managed Care – PPO | Admitting: Physician Assistant

## 2022-03-04 ENCOUNTER — Ambulatory Visit: Payer: BC Managed Care – PPO | Admitting: Physician Assistant

## 2022-05-20 ENCOUNTER — Inpatient Hospital Stay: Payer: BC Managed Care – PPO | Admitting: Anesthesiology

## 2022-05-20 ENCOUNTER — Other Ambulatory Visit: Payer: Self-pay

## 2022-05-20 ENCOUNTER — Inpatient Hospital Stay
Admission: EM | Admit: 2022-05-20 | Discharge: 2022-05-22 | DRG: 392 | Disposition: A | Discharge status: Home / Self Care | Payer: BC Managed Care – PPO | Attending: Osteopathic Medicine | Admitting: Osteopathic Medicine

## 2022-05-20 ENCOUNTER — Telehealth: Payer: BC Managed Care – PPO | Admitting: Family Medicine

## 2022-05-20 ENCOUNTER — Encounter: Payer: Self-pay | Admitting: Internal Medicine

## 2022-05-20 ENCOUNTER — Encounter
Admission: EM | Disposition: A | Discharge status: Home / Self Care | Payer: Self-pay | Source: Home / Self Care | Attending: Osteopathic Medicine

## 2022-05-20 ENCOUNTER — Emergency Department: Payer: BC Managed Care – PPO

## 2022-05-20 DIAGNOSIS — F902 Attention-deficit hyperactivity disorder, combined type: Secondary | ICD-10-CM | POA: Diagnosis present

## 2022-05-20 DIAGNOSIS — F4310 Post-traumatic stress disorder, unspecified: Secondary | ICD-10-CM | POA: Diagnosis present

## 2022-05-20 DIAGNOSIS — Z8249 Family history of ischemic heart disease and other diseases of the circulatory system: Secondary | ICD-10-CM

## 2022-05-20 DIAGNOSIS — F419 Anxiety disorder, unspecified: Secondary | ICD-10-CM | POA: Diagnosis present

## 2022-05-20 DIAGNOSIS — Z90411 Acquired partial absence of pancreas: Secondary | ICD-10-CM | POA: Diagnosis not present

## 2022-05-20 DIAGNOSIS — G8929 Other chronic pain: Secondary | ICD-10-CM | POA: Diagnosis present

## 2022-05-20 DIAGNOSIS — Z20822 Contact with and (suspected) exposure to covid-19: Secondary | ICD-10-CM | POA: Diagnosis present

## 2022-05-20 DIAGNOSIS — F32A Depression, unspecified: Secondary | ICD-10-CM | POA: Diagnosis present

## 2022-05-20 DIAGNOSIS — R1012 Left upper quadrant pain: Secondary | ICD-10-CM | POA: Diagnosis not present

## 2022-05-20 DIAGNOSIS — Z79899 Other long term (current) drug therapy: Secondary | ICD-10-CM

## 2022-05-20 DIAGNOSIS — Z888 Allergy status to other drugs, medicaments and biological substances status: Secondary | ICD-10-CM | POA: Diagnosis not present

## 2022-05-20 DIAGNOSIS — R112 Nausea with vomiting, unspecified: Secondary | ICD-10-CM

## 2022-05-20 DIAGNOSIS — Z7982 Long term (current) use of aspirin: Secondary | ICD-10-CM | POA: Diagnosis not present

## 2022-05-20 DIAGNOSIS — Z9081 Acquired absence of spleen: Secondary | ICD-10-CM | POA: Diagnosis not present

## 2022-05-20 DIAGNOSIS — K449 Diaphragmatic hernia without obstruction or gangrene: Secondary | ICD-10-CM | POA: Diagnosis present

## 2022-05-20 DIAGNOSIS — R1013 Epigastric pain: Principal | ICD-10-CM

## 2022-05-20 DIAGNOSIS — F339 Major depressive disorder, recurrent, unspecified: Secondary | ICD-10-CM | POA: Diagnosis present

## 2022-05-20 DIAGNOSIS — R197 Diarrhea, unspecified: Secondary | ICD-10-CM | POA: Insufficient documentation

## 2022-05-20 HISTORY — PX: ESOPHAGOGASTRODUODENOSCOPY (EGD) WITH PROPOFOL: SHX5813

## 2022-05-20 LAB — CBC
HCT: 39.8 % (ref 36.0–46.0)
Hemoglobin: 13.3 g/dL (ref 12.0–15.0)
MCH: 31.1 pg (ref 26.0–34.0)
MCHC: 33.4 g/dL (ref 30.0–36.0)
MCV: 93.2 fL (ref 80.0–100.0)
Platelets: 460 10*3/uL — ABNORMAL HIGH (ref 150–400)
RBC: 4.27 MIL/uL (ref 3.87–5.11)
RDW: 12.1 % (ref 11.5–15.5)
WBC: 9.7 10*3/uL (ref 4.0–10.5)
nRBC: 0 % (ref 0.0–0.2)

## 2022-05-20 LAB — COMPREHENSIVE METABOLIC PANEL
ALT: 29 U/L (ref 0–44)
AST: 26 U/L (ref 15–41)
Albumin: 4 g/dL (ref 3.5–5.0)
Alkaline Phosphatase: 64 U/L (ref 38–126)
Anion gap: 7 (ref 5–15)
BUN: 14 mg/dL (ref 6–20)
CO2: 25 mmol/L (ref 22–32)
Calcium: 9.5 mg/dL (ref 8.9–10.3)
Chloride: 108 mmol/L (ref 98–111)
Creatinine, Ser: 0.91 mg/dL (ref 0.44–1.00)
GFR, Estimated: >60 mL/min (ref >60)
Glucose, Bld: 117 mg/dL — ABNORMAL HIGH (ref 70–99)
Potassium: 4.1 mmol/L (ref 3.5–5.1)
Sodium: 140 mmol/L (ref 135–145)
Total Bilirubin: 0.7 mg/dL (ref 0.3–1.2)
Total Protein: 7.9 g/dL (ref 6.5–8.1)

## 2022-05-20 LAB — URINE DRUG SCREEN, QUALITATIVE (ARMC ONLY)
Amphetamines, Ur Screen: POSITIVE — AB
Barbiturates, Ur Screen: NOT DETECTED
Benzodiazepine, Ur Scrn: POSITIVE — AB
Cannabinoid 50 Ng, Ur ~~LOC~~: NOT DETECTED
Cocaine Metabolite,Ur ~~LOC~~: NOT DETECTED
MDMA (Ecstasy)Ur Screen: NOT DETECTED
Methadone Scn, Ur: NOT DETECTED
Opiate, Ur Screen: NOT DETECTED
Phencyclidine (PCP) Ur S: NOT DETECTED
Tricyclic, Ur Screen: POSITIVE — AB

## 2022-05-20 LAB — URINALYSIS, ROUTINE W REFLEX MICROSCOPIC
Bilirubin Urine: NEGATIVE
Glucose, UA: NEGATIVE mg/dL
Hgb urine dipstick: NEGATIVE
Ketones, ur: 5 mg/dL — AB
Leukocytes,Ua: NEGATIVE
Nitrite: NEGATIVE
Protein, ur: NEGATIVE mg/dL
Specific Gravity, Urine: 1.025 (ref 1.005–1.030)
pH: 5 (ref 5.0–8.0)

## 2022-05-20 LAB — PHOSPHORUS: Phosphorus: 3.2 mg/dL (ref 2.5–4.6)

## 2022-05-20 LAB — LACTIC ACID, PLASMA
Lactic Acid, Venous: 0.8 mmol/L (ref 0.5–1.9)
Lactic Acid, Venous: 0.8 mmol/L (ref 0.5–1.9)

## 2022-05-20 LAB — MAGNESIUM: Magnesium: 2 mg/dL (ref 1.7–2.4)

## 2022-05-20 LAB — SARS CORONAVIRUS 2 BY RT PCR: SARS Coronavirus 2 by RT PCR: NEGATIVE

## 2022-05-20 LAB — LIPASE, BLOOD: Lipase: 26 U/L (ref 11–51)

## 2022-05-20 LAB — POC URINE PREG, ED: Preg Test, Ur: NEGATIVE

## 2022-05-20 SURGERY — ESOPHAGOGASTRODUODENOSCOPY (EGD) WITH PROPOFOL
Anesthesia: General

## 2022-05-20 MED ORDER — ONDANSETRON HCL 4 MG PO TABS: 4.0000 mg | ORAL_TABLET | Freq: Four times a day (QID) | ORAL | Status: DC | PRN

## 2022-05-20 MED ORDER — SODIUM CHLORIDE 0.9 % IV SOLN: Freq: Once | INTRAVENOUS | Status: AC

## 2022-05-20 MED ORDER — GLYCOPYRROLATE 0.2 MG/ML IJ SOLN
INTRAMUSCULAR | Status: AC
  Filled 2022-05-20: qty 1

## 2022-05-20 MED ORDER — HYDROMORPHONE HCL 1 MG/ML IJ SOLN
1.0000 mg | INTRAMUSCULAR | Status: AC | PRN
  Administered 2022-05-20 – 2022-05-21 (×3): 1 mg via INTRAVENOUS
  Filled 2022-05-20 (×3): qty 1

## 2022-05-20 MED ORDER — PROPOFOL 10 MG/ML IV BOLUS
INTRAVENOUS | Status: DC | PRN
  Administered 2022-05-20: 160 mg via INTRAVENOUS
  Administered 2022-05-20: 40 mg via INTRAVENOUS

## 2022-05-20 MED ORDER — HEPARIN SODIUM (PORCINE) 5000 UNIT/ML IJ SOLN
5000.0000 [IU] | Freq: Three times a day (TID) | INTRAMUSCULAR | Status: DC
  Administered 2022-05-21 – 2022-05-22 (×4): 5000 [IU] via SUBCUTANEOUS
  Filled 2022-05-20 (×4): qty 1

## 2022-05-20 MED ORDER — PROPOFOL 500 MG/50ML IV EMUL
INTRAVENOUS | Status: DC | PRN
  Administered 2022-05-20: 150 ug/kg/min via INTRAVENOUS

## 2022-05-20 MED ORDER — FENTANYL CITRATE (PF) 100 MCG/2ML IJ SOLN
INTRAMUSCULAR | Status: AC
  Filled 2022-05-20: qty 2

## 2022-05-20 MED ORDER — ONDANSETRON HCL 4 MG/2ML IJ SOLN
4.0000 mg | Freq: Once | INTRAMUSCULAR | Status: AC
  Administered 2022-05-20: 4 mg via INTRAVENOUS
  Filled 2022-05-20: qty 2

## 2022-05-20 MED ORDER — MIDAZOLAM HCL 5 MG/5ML IJ SOLN
INTRAMUSCULAR | Status: DC | PRN
  Administered 2022-05-20: 2 mg via INTRAVENOUS

## 2022-05-20 MED ORDER — DEXAMETHASONE SODIUM PHOSPHATE 10 MG/ML IJ SOLN
INTRAMUSCULAR | Status: AC
  Filled 2022-05-20: qty 1

## 2022-05-20 MED ORDER — SODIUM CHLORIDE 0.9 % IV BOLUS
1000.0000 mL | Freq: Once | INTRAVENOUS | Status: AC
  Administered 2022-05-20: 1000 mL via INTRAVENOUS

## 2022-05-20 MED ORDER — LIDOCAINE 2% (20 MG/ML) 5 ML SYRINGE
INTRAMUSCULAR | Status: DC | PRN
  Administered 2022-05-20: 25 mg via INTRAVENOUS

## 2022-05-20 MED ORDER — HYDROMORPHONE HCL 1 MG/ML IJ SOLN
1.0000 mg | Freq: Once | INTRAMUSCULAR | Status: AC
  Administered 2022-05-20: 1 mg via INTRAVENOUS
  Filled 2022-05-20: qty 1

## 2022-05-20 MED ORDER — MIDAZOLAM HCL 2 MG/2ML IJ SOLN
INTRAMUSCULAR | Status: AC
  Filled 2022-05-20: qty 2

## 2022-05-20 MED ORDER — GLYCOPYRROLATE 0.2 MG/ML IJ SOLN
INTRAMUSCULAR | Status: DC | PRN
  Administered 2022-05-20: .2 mg via INTRAVENOUS

## 2022-05-20 MED ORDER — SUCCINYLCHOLINE CHLORIDE 200 MG/10ML IV SOSY
PREFILLED_SYRINGE | INTRAVENOUS | Status: DC | PRN
  Administered 2022-05-20: 100 mg via INTRAVENOUS

## 2022-05-20 MED ORDER — ONDANSETRON HCL 4 MG/2ML IJ SOLN
4.0000 mg | Freq: Four times a day (QID) | INTRAMUSCULAR | Status: DC | PRN
  Administered 2022-05-20 – 2022-05-22 (×5): 4 mg via INTRAVENOUS
  Filled 2022-05-20 (×6): qty 2

## 2022-05-20 MED ORDER — ACETAMINOPHEN 325 MG PO TABS: 650.0000 mg | ORAL_TABLET | Freq: Four times a day (QID) | ORAL | Status: DC | PRN

## 2022-05-20 MED ORDER — ACETAMINOPHEN 650 MG RE SUPP: 650.0000 mg | Freq: Four times a day (QID) | RECTAL | Status: DC | PRN

## 2022-05-20 MED ORDER — MELATONIN 5 MG PO TABS: 5.0000 mg | ORAL_TABLET | Freq: Every evening | ORAL | Status: DC | PRN

## 2022-05-20 MED ORDER — MORPHINE SULFATE (PF) 4 MG/ML IV SOLN
4.0000 mg | Freq: Once | INTRAVENOUS | Status: AC
  Administered 2022-05-20: 4 mg via INTRAVENOUS
  Filled 2022-05-20: qty 1

## 2022-05-20 MED ORDER — MORPHINE SULFATE (PF) 2 MG/ML IV SOLN
2.0000 mg | INTRAVENOUS | Status: AC | PRN
  Administered 2022-05-20 – 2022-05-21 (×4): 2 mg via INTRAVENOUS
  Filled 2022-05-20 (×4): qty 1

## 2022-05-20 MED ORDER — FENTANYL CITRATE (PF) 100 MCG/2ML IJ SOLN
INTRAMUSCULAR | Status: DC | PRN
  Administered 2022-05-20 (×2): 50 ug via INTRAVENOUS

## 2022-05-20 MED ORDER — LORAZEPAM 2 MG/ML IJ SOLN
0.5000 mg | Freq: Four times a day (QID) | INTRAMUSCULAR | Status: DC | PRN
  Administered 2022-05-21: 0.5 mg via INTRAVENOUS
  Filled 2022-05-20: qty 1

## 2022-05-20 MED ORDER — SODIUM CHLORIDE 0.9 % IV SOLN: INTRAVENOUS | Status: AC

## 2022-05-20 MED ORDER — IOHEXOL 300 MG/ML  SOLN
100.0000 mL | Freq: Once | INTRAMUSCULAR | Status: AC | PRN
  Administered 2022-05-20: 100 mL via INTRAVENOUS

## 2022-05-20 MED ORDER — SODIUM CHLORIDE 0.9 % IV SOLN
12.5000 mg | Freq: Four times a day (QID) | INTRAVENOUS | Status: DC | PRN
  Administered 2022-05-21 (×2): 12.5 mg via INTRAVENOUS
  Filled 2022-05-20: qty 12.5
  Filled 2022-05-20: qty 0.5

## 2022-05-20 MED ORDER — SUCCINYLCHOLINE CHLORIDE 200 MG/10ML IV SOSY
PREFILLED_SYRINGE | INTRAVENOUS | Status: AC
  Filled 2022-05-20: qty 10

## 2022-05-20 MED ORDER — SENNOSIDES-DOCUSATE SODIUM 8.6-50 MG PO TABS: 1.0000 | ORAL_TABLET | Freq: Every evening | ORAL | Status: DC | PRN

## 2022-05-20 MED ORDER — ONDANSETRON HCL 4 MG/2ML IJ SOLN
INTRAMUSCULAR | Status: DC | PRN
  Administered 2022-05-20: 4 mg via INTRAVENOUS

## 2022-05-20 MED ORDER — ONDANSETRON HCL 4 MG/2ML IJ SOLN
INTRAMUSCULAR | Status: AC
  Filled 2022-05-20: qty 2

## 2022-05-20 MED ORDER — DEXAMETHASONE SODIUM PHOSPHATE 10 MG/ML IJ SOLN
INTRAMUSCULAR | Status: DC | PRN
  Administered 2022-05-20: 10 mg via INTRAVENOUS

## 2022-05-20 MED ORDER — BENZONATATE 100 MG PO CAPS: 100.0000 mg | ORAL_CAPSULE | Freq: Two times a day (BID) | ORAL | Status: DC | PRN

## 2022-05-20 NOTE — Anesthesia Preprocedure Evaluation (Addendum)
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 (+) Teeth Intact   Pulmonary neg pulmonary ROS, neg sleep apnea, neg COPD, Patient abstained from smoking.Not current smoker   Pulmonary exam normal breath sounds clear to auscultation       Cardiovascular Exercise Tolerance: Good METS(-) hypertension(-) CAD and (-) Past MI negative cardio ROS (-) dysrhythmias  Rhythm:Regular Rate:Normal - Systolic murmurs    Neuro/Psych  PSYCHIATRIC DISORDERS Anxiety Depression    negative neurological ROS     GI/Hepatic ,GERD  Medicated,,(+)     (-) substance abuse    Endo/Other  neg diabetes    Renal/GU negative Renal ROS     Musculoskeletal   Abdominal   Peds  Hematology   Anesthesia Other Findings Past Medical History: No date: ADHD No date: Fatigue No date: Insomnia No date: PTSD (post-traumatic stress disorder) No date: Vitamin D deficiency  Reproductive/Obstetrics                             Anesthesia Physical Anesthesia Plan  ASA: 2  Anesthesia Plan: General   Post-op Pain Management:    Induction: Intravenous and Rapid sequence  PONV Risk Score and Plan: 3 and Ondansetron and Dexamethasone  Airway Management Planned: Oral ETT and Video Laryngoscope Planned  Additional Equipment: None  Intra-op Plan:   Post-operative Plan: Extubation in OR  Informed Consent: I have reviewed the patients History and Physical,  chart, labs and discussed the procedure including the risks, benefits and alternatives for the proposed anesthesia with the patient or authorized representative who has indicated his/her understanding and acceptance.     Dental advisory given  Plan Discussed with: CRNA and Surgeon  Anesthesia Plan Comments: (Patient has been nauseated and vomiting, last vomit this morning , says it was yellow and foamy (she has not eaten in at least two days). Discussed risks of anesthesia with patient, including PONV, sore throat, lip/dental/eye damage, aspiration. Rare risks discussed as well, such as cardiorespiratory and neurological sequelae, and allergic reactions. Discussed the role of CRNA in patient's perioperative care. Patient understands.)       Anesthesia Quick Evaluation

## 2022-05-20 NOTE — ED Triage Notes (Signed)
C/O left upper abdominal pain, N/V x 2 weeks.

## 2022-05-20 NOTE — Assessment & Plan Note (Signed)
-   Ativan 0.5 mg IV every 6 hours as needed for anxiety, 3 doses ordered

## 2022-05-20 NOTE — Anesthesia Procedure Notes (Signed)
Procedure Name: Intubation Date/Time: 05/20/2022 2:28 PM  Performed by: Rolla Plate, CRNAPre-anesthesia Checklist: Patient identified, Patient being monitored, Timeout performed, Emergency Drugs available and Suction available Patient Re-evaluated:Patient Re-evaluated prior to induction Oxygen Delivery Method: Circle system utilized Preoxygenation: Pre-oxygenation with 100% oxygen Induction Type: IV induction and Rapid sequence Laryngoscope Size: 3 and McGraph Grade View: Grade I Tube type: Oral Tube size: 6.5 mm Number of attempts: 1 Airway Equipment and Method: Stylet and Video-laryngoscopy Placement Confirmation: ETT inserted through vocal cords under direct vision, positive ETCO2 and breath sounds checked- equal and bilateral Secured at: 20 cm Tube secured with: Tape Dental Injury: Teeth and Oropharynx as per pre-operative assessment

## 2022-05-20 NOTE — ED Notes (Signed)
Pt is up to restroom.

## 2022-05-20 NOTE — ED Provider Notes (Signed)
[facility] Provider Note    Event Date/Time   First MD Initiated Contact with Patient 05/20/22 0935     (approximate)   History   Abdominal Pain   HPI  Megan Pacheco is a 36 y.o. female here with abdominal pain.  The patient has a fairly complex intra-abdominal history.  She has a history of what ultimately was benign abdominal mass but has had a partial pancreatectomy.  She has had recurrent issues of pancreatitis since then but it has been several years.  She states that over the last 24 hours, she has had progressive worsening aching, throbbing, severe epigastric pain.  It is also in the left upper quadrant.  She said nausea and vomiting.  She has had difficulty tolerating p.o.  Denies specific triggers for this.  Denies any recent fevers or chills.  No other medical complaints.     Physical Exam   Triage Vital Signs: ED Triage Vitals  Enc Vitals Group     BP 05/20/22 0914 (!) 127/91     Pulse Rate 05/20/22 0914 (!) 108     Resp 05/20/22 0914 18     Temp 05/20/22 0914 97.9 F (36.6 C)     Temp Source 05/20/22 0914 Oral     SpO2 05/20/22 0914 96 %     Weight 05/20/22 0909 157 lb 10.1 oz (71.5 kg)     Height 05/20/22 0909 5' 1.5" (1.562 m)     Head Circumference --      Peak Flow --      Pain Score --      Pain Loc --      Pain Edu? --      Excl. in [place]? --     Most recent vital signs: Vitals:   05/20/22 1713 05/20/22 1925  BP: (!) 122/91 (!) 116/91  Pulse: 77 62  Resp: 16 18  Temp: 98 F (36.7 C) 97.8 F (36.6 C)  SpO2: 98% 98%     General: Awake, no distress.  CV:  Good peripheral perfusion.  Regular rate and rhythm. Resp:  Normal effort.  Lungs clear to auscultation bilaterally. Abd:  No distention.  Moderate left upper quadrant tenderness.  No rebound or guarding. Other:  No lower extremity edema.   ED Results / Procedures / Treatments   Labs (all labs ordered are listed, but only abnormal results are displayed) Labs  Reviewed  COMPREHENSIVE METABOLIC PANEL - Abnormal; Notable for the following components:      Result Value   Glucose, Bld 117 (*)    All other components within normal limits  CBC - Abnormal; Notable for the following components:   Platelets 460 (*)    All other components within normal limits  URINALYSIS, ROUTINE W REFLEX MICROSCOPIC - Abnormal; Notable for the following components:   Color, Urine YELLOW (*)    APPearance HAZY (*)    Ketones, ur 5 (*)    All other components within normal limits  URINE DRUG SCREEN, QUALITATIVE ([facility] ONLY) - Abnormal; Notable for the following components:   Tricyclic, Ur Screen POSITIVE (*)    Amphetamines, Ur Screen POSITIVE (*)    Benzodiazepine, Ur Scrn POSITIVE (*)    All other components within normal limits  SARS CORONAVIRUS 2 BY RT PCR  RESPIRATORY PANEL BY PCR  LIPASE, BLOOD  LACTIC ACID, PLASMA  LACTIC ACID, PLASMA  MAGNESIUM  PHOSPHORUS  HIV ANTIBODY (ROUTINE TESTING W REFLEX)  BASIC METABOLIC PANEL  CBC  POC URINE PREG, ED     EKG    RADIOLOGY CT A/P Diaphragmatic hernia noted with small knuckle of gastric fundus within hernia   I also independently reviewed and agree with radiologist interpretations.   PROCEDURES:  Critical Care performed: No   MEDICATIONS ORDERED IN ED: Medications  acetaminophen (TYLENOL) tablet 650 mg ( Oral MAR Unhold 05/20/22 1701)    Or  acetaminophen (TYLENOL) suppository 650 mg ( Rectal MAR Unhold 05/20/22 1701)  ondansetron (ZOFRAN) tablet 4 mg ( Oral See Alternative 05/20/22 1816)    Or  ondansetron (ZOFRAN) injection 4 mg (4 mg Intravenous Given 05/20/22 1816)  heparin injection 5,000 Units ( Subcutaneous MAR Unhold 05/20/22 1701)  senna-docusate (Senokot-S) tablet 1 tablet ( Oral MAR Unhold 05/20/22 1701)  LORazepam (ATIVAN) injection 0.5 mg (has no administration in time range)  promethazine (PHENERGAN) 12.5 mg in sodium chloride 0.9 % 50 mL IVPB (has no administration in time  range)  0.9 %  sodium chloride infusion ( Intravenous New Bag/Given 05/20/22 1829)  melatonin tablet 5 mg (has no administration in time range)  benzonatate (TESSALON) capsule 100 mg (has no administration in time range)  morphine (PF) 2 MG/ML injection 2 mg (2 mg Intravenous Given 05/20/22 2032)  HYDROmorphone (DILAUDID) injection 1 mg (1 mg Intravenous Given 05/20/22 1837)  HYDROmorphone (DILAUDID) injection 1 mg (1 mg Intravenous Given 05/20/22 1015)  ondansetron (ZOFRAN) injection 4 mg (4 mg Intravenous Given 05/20/22 1016)  sodium chloride 0.9 % bolus 1,000 mL (0 mLs Intravenous Stopped 05/20/22 1245)  iohexol (OMNIPAQUE) 300 MG/ML solution 100 mL (100 mLs Intravenous Contrast Given 05/20/22 1022)  ondansetron (ZOFRAN) injection 4 mg (4 mg Intravenous Given 05/20/22 1318)  morphine (PF) 4 MG/ML injection 4 mg (4 mg Intravenous Given 05/20/22 1318)  0.9 %  sodium chloride infusion ( Intravenous New Bag/Given 05/20/22 1320)     IMPRESSION / MDM / ASSESSMENT AND PLAN / ED COURSE  I reviewed the triage vital signs and the nursing notes.                              Differential diagnosis includes, but is not limited to, recurrent pancreatitis, gastritis, GERD, hernia, obstruction, ACS, PE, PNA, MSK pain.  Patient's presentation is most consistent with acute presentation with potential threat to life or bodily function.  36 yo F with PMhx prior partial pancreatectomy here with severe epigastric and LUQ abd pain. Lab work overall very reassuring. No major leukocytosis. LFTs wnl, renal function normal.Lipase normal as well which is unusual for pt per her report. LA is reassuring. CT imaging obtained and does show new diaphragmatic hernia. Discussed with Dr. Hampton Abbot who recommended calling pt's surgeons at [facility]. Discussed with [facility] who advises EGD to assess hernia, admission for obs, but does not need transfer if EGD reassuring. Discussed w. Dr. Allen Norris of GI who will see pt for EGD. Will admit to  hospitalist. EKG nonischemic, do not suspect referred cardiac pain.    FINAL CLINICAL IMPRESSION(S) / ED DIAGNOSES   Final diagnoses:  Epigastric abdominal pain  Diaphragmatic hernia without obstruction and without gangrene     Rx / DC Orders   ED Discharge Orders     None        Note:  This document was prepared using Dragon voice recognition software and may include unintentional dictation errors.   Duffy Bruce, MD 05/20/22 2200

## 2022-05-20 NOTE — Progress Notes (Signed)
Pt refusing respiratory swab at this time. Pt states "I do not have any respiratory symptoms, the respiratory panel should've been completed at the same time as the covid swab was done." Respiratory panel deferred. Pt also wants to rest this evening so requested to not be disturbed between 11pm and 7am unless she calls.  Do not disturb sign placed to pt's door to contact this nurse prior to entering.

## 2022-05-20 NOTE — Assessment & Plan Note (Addendum)
-   Etiology workup in progress - Check COVID PCR, 20 pathogen respiratory panel, UDS - Symptomatic support: Ondansetron 4 mg p.o./IV every 6 hours as needed for nausea and vomiting, status post sodium chloride 1 L bolus and sodium chloride infusion at 100 mL/h once per EDP - Phenergan 12.5 mg IV every 6 hours as needed for refractory nausea and vomiting, 3 doses ordered - Sodium chloride 125 mL/h, 1 day ordered - Keep n.p.o. - GI has been consulted and patient is taken to endoscopy for upper endoscopy - Admit to telemetry medical, inpatient

## 2022-05-20 NOTE — Progress Notes (Signed)
[place]   Needs ED eval.

## 2022-05-20 NOTE — Assessment & Plan Note (Signed)
-   Chronic, since pancreectomy - Would recommend patient to see her PCP for consideration of pancreatic enzyme supplementation

## 2022-05-20 NOTE — H&P (Addendum)
History and Physical   Megan Pacheco BTD:[id number] DOB: 1986-03-14 DOA: 05/20/2022  PCP: [facility]  Patient coming from: Home  I have personally briefly reviewed patient's old medical records in [place].  Chief Concern: Intractable nausea vomiting  HPI: 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  Initial vitals in the ED showed temperature of 97.9, respiration rate 18, heart rate 93, blood pressure 121/91, SpO2 of 96% on room air.  Serum sodium is 140, potassium 4.1, chloride 108, bicarb 25, BUN of 14, serum creatinine of 0.91, nonfasting blood glucose 117, EGFR greater than 60, WBC 9.7, hemoglobin 13.3, platelets of 460.  Lactic acid was 0.8.  Pregnancy test was negative.  UA was negative for leukocytes and microscopic.  ED treatment: Dilaudid 1 mg IV one-time dose, morphine 4 mg one-time dose, Zofran 4 mg x 2, sodium chloride 1 L bolus.  EDP consulted [facility] who states that [facility] is completely full and they recommend endoscopy and if endoscopy shows ischemia then the patient can be transferred pending bed availability.  EDP consulted GI at [facility] who has agreed to take patient to the endoscopy suite for upper endoscopy.  At bedside patient was able to tell me her name, her age, she knows she is in the hospital.  She reports that she has been nauseous and vomiting for the past 2 weeks.  She has vomited too many times to count.  She denies any coffee-ground emesis and or bright red blood vomitus.  She denies trauma to her person.  She denies shortness of breath, abdominal pain, dysuria, hematuria.  She endorses diarrhea that has been ongoing since she had the pancreectomy.  She endorses every time she coughs she has chest  discomfort.  She denies any known fever, chills, cough.  She reports she has not had anything to eat 05/18/2022.  Social history: Patient lives at home by herself with her 2 children.  She currently vapes.  She denies EtOH and recreational drug use.  She currently works as a Marine scientist.  ROS: Constitutional: no weight change, no fever ENT/Mouth: no sore throat, no rhinorrhea Eyes: no eye pain, no vision changes Cardiovascular: no chest pain, no dyspnea,  no edema, no palpitations Respiratory: no cough, no sputum, no wheezing Gastrointestinal: + nausea, + vomiting, + diarrhea, no constipation Genitourinary: no urinary incontinence, no dysuria, no hematuria Musculoskeletal: no arthralgias, no myalgias Skin: no skin lesions, no pruritus, Neuro: + weakness, no loss of consciousness, no syncope Psych: no anxiety, no depression, + decrease appetite Heme/Lymph: no bruising, no bleeding  ED Course: Discussed with emergency medicine provider, patient requiring hospitalization for chief concerns intractable nausea and vomiting concerning for diaphragmatic hernia versus ischemia.  Assessment/Plan  Principal Problem:   Intractable nausea and vomiting Active Problems:   Attention deficit hyperactivity disorder (ADHD), combined type   Depression, recurrent (HCC)   Anxiety   Diarrhea   Assessment and Plan:  * Intractable nausea and vomiting - Etiology workup in progress - Check COVID PCR, 20 pathogen respiratory panel, UDS - Symptomatic support: Ondansetron 4 mg p.o./IV every 6 hours as needed for nausea and vomiting, status post sodium chloride 1 L bolus and sodium chloride infusion at 100 mL/h once per EDP - Phenergan 12.5 mg IV every 6 hours as needed for refractory nausea  and vomiting, 3 doses ordered - Sodium chloride 125 mL/h, 1 day ordered - Keep n.p.o. - GI has been consulted and patient is taken to endoscopy for upper endoscopy - Admit to telemetry medical, inpatient  Diarrhea -  Chronic, since pancreectomy - Would recommend patient to see her PCP for consideration of pancreatic enzyme supplementation  Anxiety - Ativan 0.5 mg IV every 6 hours as needed for anxiety, 3 doses ordered  Chart reviewed.   DVT prophylaxis: Heparin 5000 units subcutaneous every 8 hours started 05/21/2022 Code Status: Full code Diet: N.p.o. Family Communication: A phone call was offered, patient declined stating that her mother has already been called and knows she is in the hospital and getting admitted Disposition Plan: Pending clinical course Consults called: Gastroenterology Admission status: Inpatient, telemetry medical  Past Medical History:  Diagnosis Date   ADHD    Fatigue    Insomnia    PTSD (post-traumatic stress disorder)    Vitamin D deficiency    Past Surgical History:  Procedure Laterality Date   PANCREAS SURGERY     spleen ectomy     Social History:  reports that she has never smoked. She has never used smokeless tobacco. She reports that she does not currently use alcohol. She reports that she does not use drugs.  Allergies  Allergen Reactions   Uncaria Tomentosa (Cats Claw) Hives, Itching and Swelling   Family History  Problem Relation Age of Onset   Diabetes Father    Hyperthyroidism Father    Heart disease Father    Post-traumatic stress disorder Brother    Liver cancer Maternal Grandmother    Diabetes Paternal Grandfather    Family history: Family history reviewed and not pertinent.  Prior to Admission medications   Medication Sig Start Date End Date Taking? Authorizing Provider  acidophilus (RISAQUAD) CAPS capsule Take 1 capsule by mouth daily.    [provider]  albuterol (VENTOLIN HFA) 108 (90 Base) MCG/ACT inhaler Inhale 2 puffs into the lungs every 4 (four) hours as needed for wheezing or shortness of breath.     [provider]  amoxicillin-clavulanate (AUGMENTIN) 875-125 MG tablet Take 1 tablet by mouth 2 (two) times daily.  12/22/21   Scot Jun, FNP  aspirin EC 81 MG tablet Take 81 mg by mouth daily.    [provider]  BELSOMRA 5 MG TABS Take 1 tablet by mouth at bedtime. 10/22/21   [provider]  ciprofloxacin (CIPRO) 500 MG tablet Take 1 tablet (500 mg total) by mouth 2 (two) times daily. 12/16/17   Demetrios Loll, MD  clonazePAM (KLONOPIN) 0.5 MG tablet Take 0.5 mg by mouth 2 (two) times daily as needed. 10/19/21   [provider]  Desvenlafaxine Fumarate ER 100 MG TB24 Take 100 mg by mouth daily. Patient not taking: Reported on 01/29/2022    [provider]  diphenhydrAMINE HCl (BENADRYL ALLERGY PO) Take by mouth.    [provider]  escitalopram (LEXAPRO) 10 MG tablet Take 10 mg by mouth daily. 11/03/21   [provider]  escitalopram (LEXAPRO) 20 MG tablet Take 20 mg by mouth daily. 11/03/21   [provider]  esomeprazole (NEXIUM) 40 MG capsule Take 40 mg by mouth daily. 02/01/08   [provider]  fluconazole (DIFLUCAN) 150 MG tablet Take 1 tablet (150 mg total) by mouth every three (3) days as needed. Repeat if needed 12/22/21   Scot Jun, FNP  loratadine-pseudoephedrine (CLARITIN-D 12 HOUR) 5-120 MG tablet Take  1 tablet by mouth 2 (two) times daily.    [provider]  mirtazapine (REMERON) 15 MG tablet Take 7.5 mg by mouth at bedtime. 11/28/17 12/28/17  [provider]  mupirocin ointment (BACTROBAN) 2 % Apply 1 Application topically 2 (two) times daily. 12/22/21   Scot Jun, FNP  ondansetron (ZOFRAN) 8 MG tablet Take 1 tablet (8 mg total) by mouth every 8 (eight) hours as needed for nausea or vomiting. Patient not taking: Reported on 01/29/2022 10/05/18   Kennyth Arnold, FNP  STRATTERA 25 MG capsule Take by mouth. 11/03/21   [provider]  traMADol (ULTRAM) 50 MG tablet Take 50-100 mg by mouth every 8 (eight) hours as needed for moderate pain.  Patient not taking: Reported on 01/29/2022 12/09/17    [provider]  traZODone (DESYREL) 50 MG tablet Take 50 mg by mouth at bedtime. 10/30/21   [provider]  TRI-ESTARYLLA 0.18/0.215/0.25 MG-35 MCG tablet Take 1 tablet by mouth daily. 10/06/21   [provider]   Physical Exam: Vitals:   05/20/22 1300 05/20/22 1403 05/20/22 1442 05/20/22 1452  BP: 123/84 (!) 123/90 114/87 128/86  Pulse: 61 65 (!) 114 100  Resp: _0 Temp:  (!) 97.1 F (36.2 C)  (!) 97.3 F (36.3 C)  TempSrc:  Temporal  Temporal  SpO2: 99% 100% 96% 96%  Weight:  75.2 kg    Height:  _1  (1.702 m)     Constitutional: appears age-appropriate, NAD, calm, comfortable Eyes: PERRL, lids and conjunctivae normal ENMT: Mucous membranes are moist. Posterior pharynx clear of any exudate or lesions. Age-appropriate dentition. Hearing appropriate Neck: normal, supple, no masses, no thyromegaly Respiratory: clear to auscultation bilaterally, no wheezing, no crackles. Normal respiratory effort. No accessory muscle use.  Cardiovascular: Regular rate and rhythm, no murmurs / rubs / gallops. No extremity edema. 2+ pedal pulses. No carotid bruits.  Abdomen: no tenderness, no masses palpated, no hepatosplenomegaly. Bowel sounds positive.  Musculoskeletal: no clubbing / cyanosis. No joint deformity upper and lower extremities. Good ROM, no contractures, no atrophy. Normal muscle tone.  Skin: no rashes, lesions, ulcers. No induration.  Right upper extremity tattoo site appears well-healed Neurologic: Sensation intact. Strength 5/5 in all 4.  Psychiatric: Normal judgment and insight. Alert and oriented x 3. Normal mood.   EKG: independently reviewed, showing sinus rhythm with rate of 62, QTc 416  Chest x-ray on Admission: Not indicated at this time  CT ABDOMEN PELVIS W CONTRAST  Result Date: 05/20/2022 CLINICAL DATA:  Left upper quadrant abdominal pain. Nausea and vomiting for 2 weeks. EXAM: CT ABDOMEN AND PELVIS WITH CONTRAST TECHNIQUE:  Multidetector CT imaging of the abdomen and pelvis was performed using the standard protocol following bolus administration of intravenous contrast. RADIATION DOSE REDUCTION: This exam was performed according to the departmental dose-optimization program which includes automated exposure control, adjustment of the mA and/or kV according to patient size and/or use of iterative reconstruction technique. CONTRAST:  133m OMNIPAQUE IOHEXOL 300 MG/ML  SOLN COMPARISON:  CT AP 12/14/17 FINDINGS: Lower chest: No acute abnormality. Compared to 2019 there is a new diaphragmatic hernia along the posterior margin of the diaphragm on the left with herniation of the gastric fundus through the hernia. Hepatobiliary: Liver has a normal contour. No focal liver lesions are visualized. Portal vein is heterogeneously contrast opacify. Intrahepatic portal veins are normal in appearance. Veins are contrast opacified. There are few layering gallstones without evidence of cholecystitis. Pancreas: Patient is status  post distal pancreatectomy. The remaining portion of the pancreas is normal in appearance. 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 No focal fluid collection is visualized in the splenectomy bed. Adrenals/Urinary Tract: Bilateral adrenal glands are normal in appearance. Bilateral kidneys enhance homogeneously and are normal in in size. No evidence of hydronephrosis. No nephrolithiasis. The urinary bladder is fluid-filled without focal wall thickening. Stomach/Bowel: The stomach, small bowel, large bowel are normal in caliber without evidence for bowel obstruction. There is diverticulosis without diverticulitis. There is no evidence of focal wall thickening. Appendix is normal in appearance. Vascular/Lymphatic: No significant vascular findings are present. No enlarged abdominal or pelvic lymph nodes. Reproductive: Uterus and bilateral adnexa are unremarkable. Other: No abdominal wall hernia or abnormality. No abdominopelvic  ascites. Musculoskeletal: No acute or significant osseous findings. IMPRESSION: New diaphragmatic hernia along the posterior margin of the left hemidiaphragm measuring up to 2.6 cm. Mesenteric fat and a portion of the gastric fundus protrude through the diaphragmatic hernia. Electronically Signed   By: Marin Roberts M.D.   On: 05/20/2022 10:51    Labs on Admission: I have personally reviewed following labs  CBC: Recent Labs  Lab 05/20/22 0910  WBC 9.7  HGB 13.3  HCT 39.8  MCV 93.2  PLT 976*   Basic Metabolic Panel: Recent Labs  Lab 05/20/22 0910  NA 140  K 4.1  CL 108  CO2 25  GLUCOSE 117*  BUN 14  CREATININE 0.91  CALCIUM 9.5  MG 2.0  PHOS 3.2   GFR: Estimated Creatinine Clearance: 92.1 mL/min (by C-G formula based on SCr of 0.91 mg/dL).  Liver Function Tests: Recent Labs  Lab 05/20/22 0910  AST 26  ALT 29  ALKPHOS 64  BILITOT 0.7  PROT 7.9  ALBUMIN 4.0   Recent Labs  Lab 05/20/22 0910  LIPASE 26   Urine analysis:    Component Value Date/Time   COLORURINE YELLOW (A) 05/20/2022 0910   APPEARANCEUR HAZY (A) 05/20/2022 0910   APPEARANCEUR Hazy 07/07/2014 1349   LABSPEC 1.025 05/20/2022 0910   LABSPEC 1.006 07/07/2014 1349   PHURINE 5.0 05/20/2022 0910   GLUCOSEU NEGATIVE 05/20/2022 0910   GLUCOSEU Negative 07/07/2014 1349   HGBUR NEGATIVE 05/20/2022 0910   BILIRUBINUR NEGATIVE 05/20/2022 0910   BILIRUBINUR Negative 07/07/2014 1349   KETONESUR 5 (A) 05/20/2022 0910   PROTEINUR NEGATIVE 05/20/2022 0910   NITRITE NEGATIVE 05/20/2022 0910   LEUKOCYTESUR NEGATIVE 05/20/2022 0910   LEUKOCYTESUR Negative 07/07/2014 1349   Dr. Tobie Poet [facility]  If 7PM-7AM, please contact overnight-coverage provider If 7AM-7PM, please contact day coverage provider [email or web address]  05/20/2022, 3:02 PM

## 2022-05-20 NOTE — Transfer of Care (Signed)
Immediate Anesthesia Transfer of Care Note  Patient: Megan Pacheco  Procedure(s) Performed: ESOPHAGOGASTRODUODENOSCOPY (EGD) WITH PROPOFOL  Patient Location: Endoscopy Unit  Anesthesia Type:General  Level of Consciousness: awake, alert , and oriented  Airway & Oxygen Therapy: Patient Spontanous Breathing  Post-op Assessment: Report given to RN and Post -op Vital signs reviewed and stable  Post vital signs: Reviewed  Last Vitals:  Vitals Value Taken Time  BP    Temp    Pulse    Resp    SpO2      Last Pain:         Complications: No notable events documented.

## 2022-05-20 NOTE — Hospital Course (Addendum)
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 11/30: VSS. No concerns on CMP. CBC Plt increaswed at 460. Neg UPT. UA no concerns. UDS (+) amphetamines (has Rx dexmethylphenidate filled 11/27 and regularly prior), benzo (has Rx clonazepam filled 11/27 and regularly prior), TCA (Rx hx for Remeron). ED treatment: Dilaudid 1 mg IV one-time dose, morphine 4 mg one-time dose, Zofran 4 mg x 2, sodium chloride 1 L bolus. EDP consulted [facility] who states that [facility] is completely full and they recommend endoscopy and if endoscopy shows ischemia then the patient can be transferred pending bed availability. EDP consulted GI at [facility] who has agreed to take patient to the endoscopy suite for upper endoscopy. EGD no findings. Pt transferred back to medical floor and to remain NPO.  12/01: ok to advance diet but prt reports unable to tolerate. Detailed discussion w/ patient and family at bedside - will confer w/ general surgery but I do not see need for urgent intervention on the diaphragmatic hernia and I am not convinced it is cause of her pain, though may be some scar tissue / adhesions type pain? General surgery confirms no intervention needed urgently, can follow w/ her outpatient surgery team at [facility]. Will trial Lyrica for pain, suspect likely not abdominal/GI per general surgery and per GI     Consultants:  Gastroenterology  General Surgery   Procedures: EGD 05/20/2022      ASSESSMENT & PLAN:   Principal Problem:   Intractable nausea and vomiting Active Problems:   Attention deficit hyperactivity disorder (ADHD), combined type   Depression, recurrent (HCC)   Anxiety   Diarrhea   Intractable nausea and vomiting negative COVID PCR, ordered 20 pathogen respiratory panel (pt  refused) - no resp symptoms Concern for chest wall pain vs possible adhesions intraabdominal causing referred pain?  UDS (+)for prescription meds  Symptomatic support: Ondansetron 4 mg p.o./IV every 6 hours as needed for nausea and vomiting Phenergan 12.5 mg IV every 6 hours as needed for refractory nausea and vomiting, 3 doses ordered Sodium chloride 125 mL/h, 1 day ordered CLD --> advance as tolerated  GI has seen patient  Gen surg has seen patient  Anxiety Chronic use benzodiazepine  Ativan 0.5 mg IV every 6 hours as needed for anxiety, 3 doses ordered      DVT prophylaxis: heparin --> lovenox  Pertinent IV fluids/nutrition: NS while not eating  Central lines / invasive devices: none   Code Status: FULL CODE Family Communication: husband and mother (mom on phone) at bedside   Disposition: inpatient TOC needs: none Barriers to discharge / significant pending items: if tolerating diet, can hopefully discharge tomorrow for now leave on iv abx and medications

## 2022-05-20 NOTE — H&P (Signed)
Lucilla Lame, MD [facility] [address] [place], [place] [address] Phone:[phone] Fax : [phone]  Primary Care Physician:  [place] Primary Gastroenterologist:  Dr. Allen Norris  Pre-Procedure History & Physical: HPI:  Megan Pacheco is a 36 y.o. female is here for an endoscopy.   Past Medical History:  Diagnosis Date   ADHD    Fatigue    Insomnia    PTSD (post-traumatic stress disorder)    Vitamin D deficiency     Past Surgical History:  Procedure Laterality Date   PANCREAS SURGERY     spleen ectomy      Prior to Admission medications   Medication Sig Start Date End Date Taking? Authorizing Provider  dexmethylphenidate (FOCALIN XR) 15 MG 24 hr capsule Take 15 mg by mouth every morning. 05/17/22  Yes [provider]  doxepin (SINEQUAN) 10 MG capsule Take 10 mg by mouth at bedtime as needed. 05/06/22  Yes [provider]  VYVANSE 30 MG capsule Take 30 mg by mouth daily. 02/25/22  Yes [provider]  acidophilus (RISAQUAD) CAPS capsule Take 1 capsule by mouth daily.    [provider]  albuterol (VENTOLIN HFA) 108 (90 Base) MCG/ACT inhaler Inhale 2 puffs into the lungs every 4 (four) hours as needed for wheezing or shortness of breath.     [provider]  amoxicillin-clavulanate (AUGMENTIN) 875-125 MG tablet Take 1 tablet by mouth 2 (two) times daily. 12/22/21   Scot Jun, FNP  aspirin EC 81 MG tablet Take 81 mg by mouth daily.    [provider]  BELSOMRA 5 MG TABS Take 1 tablet by mouth at bedtime. 10/22/21   [provider]  ciprofloxacin (CIPRO) 500 MG tablet Take 1 tablet (500 mg total) by mouth 2 (two) times daily. 12/16/17   Demetrios Loll, MD  clonazePAM (KLONOPIN) 0.5 MG tablet Take 0.5 mg by mouth 2 (two) times daily as needed. 10/19/21   [provider]  Desvenlafaxine Fumarate ER 100 MG TB24 Take 100 mg by mouth daily. Patient not taking: Reported on 01/29/2022     [provider]  diphenhydrAMINE HCl (BENADRYL ALLERGY PO) Take by mouth.    [provider]  escitalopram (LEXAPRO) 10 MG tablet Take 10 mg by mouth daily. 11/03/21   [provider]  escitalopram (LEXAPRO) 20 MG tablet Take 20 mg by mouth daily. 11/03/21   [provider]  esomeprazole (NEXIUM) 40 MG capsule Take 40 mg by mouth daily. 02/01/08   [provider]  fluconazole (DIFLUCAN) 150 MG tablet Take 1 tablet (150 mg total) by mouth every three (3) days as needed. Repeat if needed 12/22/21   Scot Jun, FNP  loratadine-pseudoephedrine (CLARITIN-D 12 HOUR) 5-120 MG tablet Take 1 tablet by mouth 2 (two) times daily.    [provider]  mirtazapine (REMERON) 15 MG tablet Take 7.5 mg by mouth at bedtime. 11/28/17 12/28/17  [provider]  mupirocin ointment (BACTROBAN) 2 % Apply 1 Application topically 2 (two) times daily. 12/22/21   Scot Jun, FNP  ondansetron (ZOFRAN) 8 MG tablet Take 1 tablet (8 mg total) by mouth every 8 (eight) hours as needed for nausea or vomiting. Patient not taking: Reported on 01/29/2022 10/05/18   Kennyth Arnold, FNP  STRATTERA 25 MG capsule Take by mouth. 11/03/21   [provider]  traMADol (ULTRAM) 50 MG tablet Take 50-100 mg by mouth every 8 (eight) hours as needed for moderate pain.  Patient not taking: Reported on 01/29/2022 12/09/17   [provider]  traZODone (DESYREL) 50 MG tablet Take 50 mg by mouth at bedtime. 10/30/21   [provider]  TRI-ESTARYLLA 0.18/0.215/0.25 MG-35 MCG tablet Take 1 tablet by mouth daily. 10/06/21   [provider]    Allergies as of 05/20/2022 - Review Complete 05/20/2022  Allergen Reaction Noted   Uncaria tomentosa (cats claw) Hives, Itching, and Swelling [birth year]-1988    Family History  Problem Relation Age of Onset   Diabetes Father    Hyperthyroidism Father    Heart disease Father    Post-traumatic stress disorder Brother     Liver cancer Maternal Grandmother    Diabetes Paternal Grandfather     Social History   Socioeconomic History   Marital status: Significant Other    Spouse name: Not on file   Number of children: Not on file   Years of education: Not on file   Highest education level: Not on file  Occupational History   Not on file  Tobacco Use   Smoking status: Never   Smokeless tobacco: Never  Vaping Use   Vaping Use: Some days  Substance and Sexual Activity   Alcohol use: Not Currently   Drug use: Never   Sexual activity: Yes    Birth control/protection: Pill    Comment: LMP 12/06/21  Other Topics Concern   Not on file  Social History Narrative   Not on file   Social Determinants of Health   Financial Resource Strain: Not on file  Food Insecurity: Not on file  Transportation Needs: Not on file  Physical Activity: Not on file  Stress: Not on file  Social Connections: Not on file  Intimate Partner Violence: Not on file    Review of Systems: See HPI, otherwise negative ROS  Physical Exam: BP (!) 123/90   Pulse 65   Temp (!) 97.1 F (36.2 C) (Temporal)   Resp 16   Ht '5\' 7"'$  (1.702 m)   Wt 75.2 kg   SpO2 100%   BMI 25.95 kg/m  General:   Alert,  pleasant and cooperative in NAD Head:  Normocephalic and atraumatic. Neck:  Supple; no masses or thyromegaly. Lungs:  Clear throughout to auscultation.    Heart:  Regular rate and rhythm. Abdomen:  Soft, nontender and nondistended. Normal bowel sounds, without guarding, and without rebound.   Neurologic:  Alert and  oriented x4;  grossly normal neurologically.  Impression/Plan: Megan Pacheco is here for an endoscopy to be performed for nausea and vomiting  Risks, benefits, limitations, and alternatives regarding  endoscopy have been reviewed with the patient.  Questions have been answered.  All parties agreeable.   Lucilla Lame, MD  05/20/2022, 2:08 PM

## 2022-05-20 NOTE — ED Notes (Signed)
Patient transported to CT 

## 2022-05-20 NOTE — Op Note (Signed)
[facility] Gastroenterology Patient Name: Martinique Engelson Procedure Date: 05/20/2022 2:14 PM MRN: [id number] Account #: [id number] Date of Birth: 1985/08/22 Admit Type: Emergency Department Age: 36 Room: [facility] ENDO ROOM 3 Gender: Female Note Status: Finalized Instrument Name: Upper Endoscope [id number] Procedure:             Upper GI endoscopy Indications:           Nausea with vomiting Providers:             Lucilla Lame MD, MD Referring MD:          No Local Md, MD (Referring MD) Medicines:             Propofol per Anesthesia Complications:         No immediate complications. Procedure:             Pre-Anesthesia Assessment:                        - Prior to the procedure, a History and Physical was                         performed, and patient medications and allergies were                         reviewed. The patient's tolerance of previous                         anesthesia was also reviewed. The risks and benefits                         of the procedure and the sedation options and risks                         were discussed with the patient. All questions were                         answered, and informed consent was obtained. Prior                         Anticoagulants: The patient has taken no anticoagulant                         or antiplatelet agents. ASA Grade Assessment: II - A                         patient with mild systemic disease. After reviewing                         the risks and benefits, the patient was deemed in                         satisfactory condition to undergo the procedure.                        After obtaining informed consent, the endoscope was                         passed under direct vision. Throughout the procedure,  the patient's blood pressure, pulse, and oxygen                         saturations were monitored continuously. The Endoscope                         was introduced through the  mouth, and advanced to the                         second part of duodenum. The upper GI endoscopy was                         accomplished without difficulty. The patient tolerated                         the procedure well. Findings:      The examined esophagus was normal.      The entire examined stomach was normal.      The examined duodenum was normal. Impression:            - Normal esophagus.                        - Normal stomach.                        - Normal examined duodenum.                        - No specimens collected. Recommendation:        - Return patient to hospital ward for ongoing care.                        - NPO.                        - Continue present medications. Procedure Code(s):     --- Professional ---                        [phone], Esophagogastroduodenoscopy, flexible,                         transoral; diagnostic, including collection of                         specimen(s) by brushing or washing, when performed                         (separate procedure) Diagnosis Code(s):     --- Professional ---                        R11.2, Nausea with vomiting, unspecified CPT copyright 2022 American Medical Association. All rights reserved. The codes documented in this report are preliminary and upon coder review may  be revised to meet current compliance requirements. Lucilla Lame MD, MD 05/20/2022 2:34:37 PM This report has been signed electronically. Number of Addenda: 0 Note Initiated On: 05/20/2022 2:14 PM Estimated Blood Loss:  Estimated blood loss: none.      [facility]

## 2022-05-21 ENCOUNTER — Encounter: Payer: Self-pay | Admitting: Gastroenterology

## 2022-05-21 DIAGNOSIS — K449 Diaphragmatic hernia without obstruction or gangrene: Secondary | ICD-10-CM | POA: Diagnosis not present

## 2022-05-21 DIAGNOSIS — R1012 Left upper quadrant pain: Secondary | ICD-10-CM

## 2022-05-21 DIAGNOSIS — R112 Nausea with vomiting, unspecified: Secondary | ICD-10-CM | POA: Diagnosis not present

## 2022-05-21 LAB — CBC
HCT: 37.2 % (ref 36.0–46.0)
Hemoglobin: 12.9 g/dL (ref 12.0–15.0)
MCH: 31.9 pg (ref 26.0–34.0)
MCHC: 34.7 g/dL (ref 30.0–36.0)
MCV: 91.9 fL (ref 80.0–100.0)
Platelets: 449 10*3/uL — ABNORMAL HIGH (ref 150–400)
RBC: 4.05 MIL/uL (ref 3.87–5.11)
RDW: 12.4 % (ref 11.5–15.5)
WBC: 10.3 10*3/uL (ref 4.0–10.5)
nRBC: 0 % (ref 0.0–0.2)

## 2022-05-21 LAB — BASIC METABOLIC PANEL
Anion gap: 6 (ref 5–15)
BUN: 10 mg/dL (ref 6–20)
CO2: 24 mmol/L (ref 22–32)
Calcium: 8.4 mg/dL — ABNORMAL LOW (ref 8.9–10.3)
Chloride: 109 mmol/L (ref 98–111)
Creatinine, Ser: 0.77 mg/dL (ref 0.44–1.00)
GFR, Estimated: >60 mL/min (ref >60)
Glucose, Bld: 125 mg/dL — ABNORMAL HIGH (ref 70–99)
Potassium: 3.9 mmol/L (ref 3.5–5.1)
Sodium: 139 mmol/L (ref 135–145)

## 2022-05-21 LAB — HIV ANTIBODY (ROUTINE TESTING W REFLEX): HIV Screen 4th Generation wRfx: NONREACTIVE

## 2022-05-21 MED ORDER — PSEUDOEPHEDRINE HCL ER 120 MG PO TB12
120.0000 mg | ORAL_TABLET | Freq: Two times a day (BID) | ORAL | Status: DC
  Administered 2022-05-21 – 2022-05-22 (×3): 120 mg via ORAL
  Filled 2022-05-21 (×3): qty 1

## 2022-05-21 MED ORDER — LIDOCAINE 5 % EX PTCH
2.0000 | MEDICATED_PATCH | CUTANEOUS | Status: DC
  Administered 2022-05-21 – 2022-05-22 (×2): 2 via TRANSDERMAL
  Filled 2022-05-21 (×2): qty 2

## 2022-05-21 MED ORDER — PREGABALIN 50 MG PO CAPS
50.0000 mg | ORAL_CAPSULE | Freq: Two times a day (BID) | ORAL | Status: DC
  Administered 2022-05-21 – 2022-05-22 (×2): 50 mg via ORAL
  Filled 2022-05-21 (×2): qty 1

## 2022-05-21 MED ORDER — CLONAZEPAM 0.5 MG PO TABS
0.5000 mg | ORAL_TABLET | Freq: Two times a day (BID) | ORAL | Status: DC | PRN
  Administered 2022-05-21: 0.5 mg via ORAL
  Filled 2022-05-21: qty 1

## 2022-05-21 MED ORDER — DEXMETHYLPHENIDATE HCL ER 5 MG PO CP24
15.0000 mg | ORAL_CAPSULE | Freq: Every morning | ORAL | Status: DC
  Administered 2022-05-21 – 2022-05-22 (×2): 15 mg via ORAL
  Filled 2022-05-21 (×2): qty 3

## 2022-05-21 MED ORDER — LORATADINE-PSEUDOEPHEDRINE ER 5-120 MG PO TB12: 1.0000 | ORAL_TABLET | Freq: Two times a day (BID) | ORAL | Status: DC

## 2022-05-21 MED ORDER — MORPHINE SULFATE (PF) 2 MG/ML IV SOLN
2.0000 mg | INTRAVENOUS | Status: DC | PRN
  Administered 2022-05-21 – 2022-05-22 (×5): 2 mg via INTRAVENOUS
  Filled 2022-05-21 (×5): qty 1

## 2022-05-21 MED ORDER — SENNOSIDES-DOCUSATE SODIUM 8.6-50 MG PO TABS
2.0000 | ORAL_TABLET | Freq: Two times a day (BID) | ORAL | Status: DC
  Administered 2022-05-21 – 2022-05-22 (×2): 2 via ORAL
  Filled 2022-05-21 (×2): qty 2

## 2022-05-21 MED ORDER — KETOROLAC TROMETHAMINE 30 MG/ML IJ SOLN
30.0000 mg | Freq: Once | INTRAMUSCULAR | Status: AC
  Administered 2022-05-21: 30 mg via INTRAVENOUS
  Filled 2022-05-21: qty 1

## 2022-05-21 MED ORDER — DOXEPIN HCL 10 MG PO CAPS
10.0000 mg | ORAL_CAPSULE | Freq: Every day | ORAL | Status: DC
  Administered 2022-05-21: 10 mg via ORAL
  Filled 2022-05-21: qty 1

## 2022-05-21 MED ORDER — LORATADINE 10 MG PO TABS
5.0000 mg | ORAL_TABLET | Freq: Two times a day (BID) | ORAL | Status: DC
  Administered 2022-05-21 – 2022-05-22 (×3): 5 mg via ORAL
  Filled 2022-05-21 (×3): qty 1

## 2022-05-21 MED ORDER — NORGESTIM-ETH ESTRAD TRIPHASIC 0.18/0.215/0.25 MG-35 MCG PO TABS: 1.0000 | ORAL_TABLET | Freq: Every day | ORAL | Status: DC
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
## 2022-05-21 NOTE — Plan of Care (Signed)
  Problem: Health Behavior/Discharge Planning: Goal: Ability to manage health-related needs will improve Outcome: Progressing   

## 2022-05-21 NOTE — Progress Notes (Signed)
PROGRESS NOTE    Megan Pacheco   HER:[id number] DOB: 11/14/1985  DOA: 05/20/2022 Date of Service: 05/21/22 PCP: [facility] Narrative / Hospital Course:  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 11/30: VSS. No concerns on CMP. CBC Plt increaswed at 460. Neg UPT. UA no concerns. UDS (+) amphetamines (has Rx dexmethylphenidate filled 11/27 and regularly prior), benzo (has Rx clonazepam filled 11/27 and regularly prior), TCA (Rx hx for Remeron). ED treatment: Dilaudid 1 mg IV one-time dose, morphine 4 mg one-time dose, Zofran 4 mg x 2, sodium chloride 1 L bolus. EDP consulted [facility] who states that [facility] is completely full and they recommend endoscopy and if endoscopy shows ischemia then the patient can be transferred pending bed availability. EDP consulted GI at [facility] who has agreed to take patient to the endoscopy suite for upper endoscopy. EGD no findings. Pt transferred back to medical floor and to remain NPO.  12/01: ok to advance diet but prt reports unable to tolerate. Detailed discussion w/ patient and family at bedside - will confer w/ general surgery but I do not see need for urgent intervention on the diaphragmatic hernia and I am not convinced it is cause of her pain, though may be some scar tissue / adhesions type pain? General surgery confirms no intervention needed urgently, can follow w/ her outpatient surgery team at [facility]. Will trial Lyrica for pain, suspect likely not abdominal/GI per general surgery and per GI     Consultants:  Gastroenterology  General Surgery   Procedures: EGD 05/20/2022      ASSESSMENT & PLAN:   Principal Problem:   Intractable nausea and vomiting Active Problems:   Attention deficit  hyperactivity disorder (ADHD), combined type   Depression, recurrent (HCC)   Anxiety   Diarrhea   Intractable nausea and vomiting negative COVID PCR, ordered 20 pathogen respiratory panel (pt refused) - no resp symptoms Concern for chest wall pain vs possible adhesions intraabdominal causing referred pain?  UDS (+)for prescription meds  Symptomatic support: Ondansetron 4 mg p.o./IV every 6 hours as needed for nausea and vomiting Phenergan 12.5 mg IV every 6 hours as needed for refractory nausea and vomiting, 3 doses ordered Sodium chloride 125 mL/h, 1 day ordered CLD --> advance as tolerated  GI has seen patient  Gen surg has seen patient  Anxiety Chronic use benzodiazepine  Ativan 0.5 mg IV every 6 hours as needed for anxiety, 3 doses ordered      DVT prophylaxis: heparin --> lovenox  Pertinent IV fluids/nutrition: NS while not eating  Central lines / invasive devices: none   Code Status: FULL CODE Family Communication: husband and mother (mom on phone) at bedside   Disposition: inpatient TOC needs: none Barriers to discharge / significant pending items: if tolerating diet, can hopefully discharge tomorrow for now leave on iv abx and medications              Subjective:  Patient reports pain in LUQ, requesting more medications  Denies CP/SOB.  Denies new weakness.  Reports no concerns w/ urination. Last BM yesterday      Objective Findings:  Vitals:   05/20/22 1536 05/20/22 1713 05/20/22 1925 05/21/22 0701  BP: (!) 111/92 (!) 122/91 (!) 116/91 102/74  Pulse:  77 62 (!)  58  Resp:  '16 18 18  '$ Temp:  98 F (36.7 C) 97.8 F (36.6 C) (!) 97.5 F (36.4 C)  TempSrc:   Oral Oral  SpO2: 98% 98% 98% 98%  Weight:      Height:        Intake/Output Summary (Last 24 hours) at 05/21/2022 1635 Last data filed at 05/21/2022 0500 Gross per 24 hour  Intake 2069.84 ml  Output --  Net 2069.84 ml   Filed Weights   05/20/22 0909 05/20/22 1403  Weight: 71.5 kg  75.2 kg    Examination:  Constitutional:  VS as above General Appearance: alert, well-developed, well-nourished, NAD Respiratory: Normal respiratory effort No wheeze No rhonchi No rales Cardiovascular: S1/S2 normal No murmur No rub/gallop auscultated No lower extremity edema Gastrointestinal: (+)LUQ tenderness No masses Psychiatric: Normal judgment/insight Normal mood and affect       Scheduled Medications:   dexmethylphenidate  15 mg Oral q morning   doxepin  10 mg Oral QHS   heparin  5,000 Units Subcutaneous Q8H   lidocaine  2 patch Transdermal Q24H   loratadine  5 mg Oral BID   And   pseudoephedrine  120 mg Oral BID   Norgestimate-Ethinyl Estradiol Triphasic  1 tablet Oral Daily   pregabalin  50 mg Oral BID   senna-docusate  2 tablet Oral BID    Continuous Infusions:  promethazine (PHENERGAN) injection (IM or IVPB) 12.5 mg (05/21/22 0225)    PRN Medications:  acetaminophen **OR** acetaminophen, benzonatate, clonazePAM, melatonin, morphine injection, ondansetron **OR** ondansetron (ZOFRAN) IV, promethazine (PHENERGAN) injection (IM or IVPB), senna-docusate  Antimicrobials:  Anti-infectives (From admission, onward)    None           Data Reviewed: I have personally reviewed following labs and imaging studies  CBC: Recent Labs  Lab 05/20/22 0910 05/21/22 0713  WBC 9.7 10.3  HGB 13.3 12.9  HCT 39.8 37.2  MCV 93.2 91.9  PLT 460* 341*   Basic Metabolic Panel: Recent Labs  Lab 05/20/22 0910 05/21/22 0713  NA 140 139  K 4.1 3.9  CL 108 109  CO2 25 24  GLUCOSE 117* 125*  BUN 14 10  CREATININE 0.91 0.77  CALCIUM 9.5 8.4*  MG 2.0  --   PHOS 3.2  --    GFR: Estimated Creatinine Clearance: 104.8 mL/min (by C-G formula based on SCr of 0.77 mg/dL). Liver Function Tests: Recent Labs  Lab 05/20/22 0910  AST 26  ALT 29  ALKPHOS 64  BILITOT 0.7  PROT 7.9  ALBUMIN 4.0   Recent Labs  Lab 05/20/22 0910  LIPASE 26   No results  for input(s): "AMMONIA" in the last 168 hours. Coagulation Profile: No results for input(s): "INR", "PROTIME" in the last 168 hours. Cardiac Enzymes: No results for input(s): "CKTOTAL", "CKMB", "CKMBINDEX", "TROPONINI" in the last 168 hours. BNP (last 3 results) No results for input(s): "PROBNP" in the last 8760 hours. HbA1C: No results for input(s): "HGBA1C" in the last 72 hours. CBG: No results for input(s): "GLUCAP" in the last 168 hours. Lipid Profile: No results for input(s): "CHOL", "HDL", "LDLCALC", "TRIG", "CHOLHDL", "LDLDIRECT" in the last 72 hours. Thyroid Function Tests: No results for input(s): "TSH", "T4TOTAL", "FREET4", "T3FREE", "THYROIDAB" in the last 72 hours. Anemia Panel: No results for input(s): "VITAMINB12", "FOLATE", "FERRITIN", "TIBC", "IRON", "RETICCTPCT" in the last 72 hours. Most Recent Urinalysis On File:     Component Value Date/Time   COLORURINE YELLOW (A) 05/20/2022 0910   APPEARANCEUR HAZY (A)  05/20/2022 0910   APPEARANCEUR Hazy 07/07/2014 1349   LABSPEC 1.025 05/20/2022 0910   LABSPEC 1.006 07/07/2014 1349   PHURINE 5.0 05/20/2022 0910   GLUCOSEU NEGATIVE 05/20/2022 0910   GLUCOSEU Negative 07/07/2014 1349   HGBUR NEGATIVE 05/20/2022 0910   BILIRUBINUR NEGATIVE 05/20/2022 0910   BILIRUBINUR Negative 07/07/2014 1349   KETONESUR 5 (A) 05/20/2022 0910   PROTEINUR NEGATIVE 05/20/2022 0910   NITRITE NEGATIVE 05/20/2022 0910   LEUKOCYTESUR NEGATIVE 05/20/2022 0910   LEUKOCYTESUR Negative 07/07/2014 1349   Sepsis Labs: '@LABRCNTIP'$ (procalcitonin:4,lacticidven:4)  Recent Results (from the past 240 hour(s))  SARS Coronavirus 2 by RT PCR (hospital order, performed in [facility] hospital lab) *cepheid single result test* Anterior Nasal Swab     Status: None   Collection Time: 05/20/22  1:19 PM   Specimen: Anterior Nasal Swab  Result Value Ref Range Status   SARS Coronavirus 2 by RT PCR NEGATIVE NEGATIVE Final    Comment: (NOTE) SARS-CoV-2 target  nucleic acids are NOT DETECTED.  The SARS-CoV-2 RNA is generally detectable in upper and lower respiratory specimens during the acute phase of infection. The lowest concentration of SARS-CoV-2 viral copies this assay can detect is 250 copies / mL. A negative result does not preclude SARS-CoV-2 infection and should not be used as the sole basis for treatment or other patient management decisions.  A negative result may occur with improper specimen collection / handling, submission of specimen other than nasopharyngeal swab, presence of viral mutation(s) within the areas targeted by this assay, and inadequate number of viral copies (<250 copies / mL). A negative result must be combined with clinical observations, patient history, and epidemiological information.  Fact Sheet for Patients:   https://www.patel.info/  Fact Sheet for Healthcare Providers: https://hall.com/  This test is not yet approved or  cleared by the [place] FDA and has been authorized for detection and/or diagnosis of SARS-CoV-2 by FDA under an Emergency Use Authorization (EUA).  This EUA will remain in effect (meaning this test can be used) for the duration of the COVID-19 declaration under Section 564(b)(1) of the Act, 21 U.S.C. section 360bbb-3(b)(1), unless the authorization is terminated or revoked sooner.  Performed at [facility], [address], [place], [place] [address]          Radiology Studies: CT ABDOMEN PELVIS W CONTRAST  Result Date: 05/20/2022 CLINICAL DATA:  Left upper quadrant abdominal pain. Nausea and vomiting for 2 weeks. EXAM: CT ABDOMEN AND PELVIS WITH CONTRAST TECHNIQUE: Multidetector CT imaging of the abdomen and pelvis was performed using the standard protocol following bolus administration of intravenous contrast. RADIATION DOSE REDUCTION: This exam was performed according to the departmental dose-optimization program which  includes automated exposure control, adjustment of the mA and/or kV according to patient size and/or use of iterative reconstruction technique. CONTRAST:  13m OMNIPAQUE IOHEXOL 300 MG/ML  SOLN COMPARISON:  CT AP 12/14/17 FINDINGS: Lower chest: No acute abnormality. Compared to 2019 there is a new diaphragmatic hernia along the posterior margin of the diaphragm on the left with herniation of the gastric fundus through the hernia. Hepatobiliary: Liver has a normal contour. No focal liver lesions are visualized. Portal vein is heterogeneously contrast opacify. Intrahepatic portal veins are normal in appearance. Veins are contrast opacified. There are few layering gallstones without evidence of cholecystitis. Pancreas: Patient is status post distal pancreatectomy. The remaining portion of the pancreas is normal in appearance. 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 No focal fluid collection is visualized in the splenectomy bed.  Adrenals/Urinary Tract: Bilateral adrenal glands are normal in appearance. Bilateral kidneys enhance homogeneously and are normal in in size. No evidence of hydronephrosis. No nephrolithiasis. The urinary bladder is fluid-filled without focal wall thickening. Stomach/Bowel: The stomach, small bowel, large bowel are normal in caliber without evidence for bowel obstruction. There is diverticulosis without diverticulitis. There is no evidence of focal wall thickening. Appendix is normal in appearance. Vascular/Lymphatic: No significant vascular findings are present. No enlarged abdominal or pelvic lymph nodes. Reproductive: Uterus and bilateral adnexa are unremarkable. Other: No abdominal wall hernia or abnormality. No abdominopelvic ascites. Musculoskeletal: No acute or significant osseous findings. IMPRESSION: New diaphragmatic hernia along the posterior margin of the left hemidiaphragm measuring up to 2.6 cm. Mesenteric fat and a portion of the gastric fundus protrude through the  diaphragmatic hernia. Electronically Signed   By: Marin Roberts M.D.   On: 05/20/2022 10:51            LOS: 1 day    Time spent: 50 minutes     Emeterio Reeve, DO [facility] 05/21/2022, 4:35 PM    Dictation software may have been used to generate the above note. Typos may occur and escape review in typed/dictated notes. Please contact Dr Sheppard Coil directly for clarity if needed.  Staff may message me via secure chat in [place]  but this may not receive an immediate response,  please page me for urgent matters!  If 7PM-7AM, please contact night coverage [email or web address]

## 2022-05-21 NOTE — Progress Notes (Addendum)
Megan Lame, MD [facility]   [address], [place], [place] [address] Phone: [phone] Fax : [phone]   Subjective: This patient came in yesterday with nausea vomiting abdominal pain.  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  The patient was brought up from the ER yesterday with the concern of ischemic gut.  The upper endoscopy was Apley normal without any signs of inflammation ulcerations or any cause for the patient's symptoms.  The patient reports that she is not feeling any better today and still has abdominal pain.   Objective: Vital signs in last 24 hours: Vitals:   05/20/22 1536 05/20/22 1713 05/20/22 1925 05/21/22 0701  BP: (!) 111/92 (!) 122/91 (!) 116/91 102/74  Pulse:  77 62 (!) 58  Resp:  '16 18 18  '$ Temp:  98 F (36.7 C) 97.8 F (36.6 C) (!) 97.5 F (36.4 C)  TempSrc:   Oral Oral  SpO2: 98% 98% 98% 98%  Weight:      Height:       Weight change:   Intake/Output Summary (Last 24 hours) at 05/21/2022 1704 Last data filed at 05/21/2022 0500 Gross per 24 hour  Intake 2069.84 ml  Output --  Net 2069.84 ml     Exam: Abdomen: The patient's abdominal pain was reproducible with flexion of the abdominal wall muscles.  While palpating the abdominal wall while not flexing the patient reported the pain to be 7-10 x 10 out of 10 with flexing abdominal wall muscles by raising the leg 6 inches above the bed.  The patient stated that that brought on her nausea and was tearful at that time.   Lab Results: '@LABTEST2'$ @ Micro Results: Recent Results (from the past 240 hour(s))  SARS Coronavirus 2 by RT PCR (hospital order, performed in [facility] hospital lab) *cepheid single result test* Anterior Nasal Swab     Status: None   Collection Time: 05/20/22  1:19 PM   Specimen: Anterior Nasal Swab  Result Value Ref Range Status   SARS Coronavirus 2 by RT PCR NEGATIVE NEGATIVE Final    Comment: (NOTE) SARS-CoV-2 target nucleic acids are  NOT DETECTED.  The SARS-CoV-2 RNA is generally detectable in upper and lower respiratory specimens during the acute phase of infection. The lowest concentration of SARS-CoV-2 viral copies this assay can detect is 250 copies / mL. A negative result does not preclude SARS-CoV-2 infection and should not be used as the sole basis for treatment or other patient management decisions.  A negative result may occur with improper specimen collection / handling, submission of specimen other than nasopharyngeal swab, presence of viral mutation(s) within the areas targeted by this assay, and inadequate number of viral copies (<250 copies / mL). A negative result must be combined with clinical observations, patient history, and epidemiological information.  Fact Sheet for Patients:   https://www.patel.info/  Fact Sheet for Healthcare Providers: https://hall.com/  This test is not yet approved or  cleared by the [place] FDA and has been authorized for detection and/or diagnosis of SARS-CoV-2 by FDA under an Emergency Use Authorization (EUA).  This EUA will remain in effect (meaning this test can be used) for the duration of the COVID-19 declaration under Section 564(b)(1) of the Act, 21 U.S.C. section 360bbb-3(b)(1), unless the authorization is terminated or revoked sooner.  Performed at [facility], [address], [place],  [address]    Studies/Results: CT ABDOMEN PELVIS W CONTRAST  Result Date: 05/20/2022 CLINICAL  DATA:  Left upper quadrant abdominal pain. Nausea and vomiting for 2 weeks. EXAM: CT ABDOMEN AND PELVIS WITH CONTRAST TECHNIQUE: Multidetector CT imaging of the abdomen and pelvis was performed using the standard protocol following bolus administration of intravenous contrast. RADIATION DOSE REDUCTION: This exam was performed according to the departmental dose-optimization program which includes automated exposure  control, adjustment of the mA and/or kV according to patient size and/or use of iterative reconstruction technique. CONTRAST:  164m OMNIPAQUE IOHEXOL 300 MG/ML  SOLN COMPARISON:  CT AP 12/14/17 FINDINGS: Lower chest: No acute abnormality. Compared to 2019 there is a new diaphragmatic hernia along the posterior margin of the diaphragm on the left with herniation of the gastric fundus through the hernia. Hepatobiliary: Liver has a normal contour. No focal liver lesions are visualized. Portal vein is heterogeneously contrast opacify. Intrahepatic portal veins are normal in appearance. Veins are contrast opacified. There are few layering gallstones without evidence of cholecystitis. Pancreas: Patient is status post distal pancreatectomy. The remaining portion of the pancreas is normal in appearance. 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 No focal fluid collection is visualized in the splenectomy bed. Adrenals/Urinary Tract: Bilateral adrenal glands are normal in appearance. Bilateral kidneys enhance homogeneously and are normal in in size. No evidence of hydronephrosis. No nephrolithiasis. The urinary bladder is fluid-filled without focal wall thickening. Stomach/Bowel: The stomach, small bowel, large bowel are normal in caliber without evidence for bowel obstruction. There is diverticulosis without diverticulitis. There is no evidence of focal wall thickening. Appendix is normal in appearance. Vascular/Lymphatic: No significant vascular findings are present. No enlarged abdominal or pelvic lymph nodes. Reproductive: Uterus and bilateral adnexa are unremarkable. Other: No abdominal wall hernia or abnormality. No abdominopelvic ascites. Musculoskeletal: No acute or significant osseous findings. IMPRESSION: New diaphragmatic hernia along the posterior margin of the left hemidiaphragm measuring up to 2.6 cm. Mesenteric fat and a portion of the gastric fundus protrude through the diaphragmatic hernia. Electronically  Signed   By: HMarin RobertsM.D.   On: 05/20/2022 10:51   Medications: I have reviewed the patient's current medications. Scheduled Meds:  dexmethylphenidate  15 mg Oral q morning   doxepin  10 mg Oral QHS   heparin  5,000 Units Subcutaneous Q8H   lidocaine  2 patch Transdermal Q24H   loratadine  5 mg Oral BID   And   pseudoephedrine  120 mg Oral BID   Norgestimate-Ethinyl Estradiol Triphasic  1 tablet Oral Daily   pregabalin  50 mg Oral BID   senna-docusate  2 tablet Oral BID   Continuous Infusions:  promethazine (PHENERGAN) injection (IM or IVPB) 12.5 mg (05/21/22 0225)   PRN Meds:.acetaminophen **OR** acetaminophen, benzonatate, clonazePAM, melatonin, morphine injection, ondansetron **OR** ondansetron (ZOFRAN) IV, promethazine (PHENERGAN) injection (IM or IVPB), senna-docusate   Assessment: Principal Problem:   Intractable nausea and vomiting Active Problems:   Attention deficit hyperactivity disorder (ADHD), combined type   Depression, recurrent ([facility]   Anxiety   Diarrhea    Plan: This patient had an EGD that was normal for any GI cause for her nausea and vomiting.  On physical exam today the patient clearly has musculoskeletal pain that is exacerbated with flexion of the abdominal wall with resulting 10 out of 10 pain with 1 finger palpation.  This also elicited her nausea at the time of the exam.  I recommend treating the patient Musculoskeletal pain which seems to be the main component of her issues. Nothing further to do from a GI point of view.  LOS: 1 day   Lewayne Bunting 05/21/2022, 5:04 PM Pager [phone] 7am-5pm  Check AMION for 5pm -7am coverage and on weekends

## 2022-05-21 NOTE — Anesthesia Postprocedure Evaluation (Signed)
Anesthesia Post Note  Patient: Megan Pacheco  Procedure(s) Performed: ESOPHAGOGASTRODUODENOSCOPY (EGD) WITH PROPOFOL  Patient location during evaluation: Endoscopy Anesthesia Type: General Level of consciousness: awake and alert Pain management: pain level controlled Vital Signs Assessment: post-procedure vital signs reviewed and stable Respiratory status: spontaneous breathing, nonlabored ventilation, respiratory function stable and patient connected to nasal cannula oxygen Cardiovascular status: blood pressure returned to baseline and stable Postop Assessment: no apparent nausea or vomiting Anesthetic complications: no   No notable events documented.   Last Vitals:  Vitals:   05/20/22 1925 05/21/22 0701  BP: (!) 116/91 102/74  Pulse: 62 (!) 58  Resp: 18 18  Temp:  (!) 36.4 C  SpO2: 98% 98%    Last Pain:  Vitals:   05/21/22 0934  TempSrc:   PainSc: 5                  Arita Miss

## 2022-05-22 DIAGNOSIS — R112 Nausea with vomiting, unspecified: Secondary | ICD-10-CM | POA: Diagnosis not present

## 2022-05-22 MED ORDER — ONDANSETRON HCL 4 MG PO TABS: 4.0000 mg | ORAL_TABLET | Freq: Four times a day (QID) | ORAL | 0 refills | Status: AC | PRN

## 2022-05-22 MED ORDER — LIDOCAINE 5 % EX PTCH: 2.0000 | MEDICATED_PATCH | CUTANEOUS | 0 refills | Status: AC

## 2022-05-22 MED ORDER — ACETAMINOPHEN 500 MG PO TABS: 500.0000 mg | ORAL_TABLET | Freq: Three times a day (TID) | ORAL | 0 refills | Status: AC

## 2022-05-22 MED ORDER — OXYCODONE HCL 5 MG PO TABS: 5.0000 mg | ORAL_TABLET | Freq: Four times a day (QID) | ORAL | 0 refills | Status: AC | PRN

## 2022-05-22 MED ORDER — SENNOSIDES-DOCUSATE SODIUM 8.6-50 MG PO TABS: 2.0000 | ORAL_TABLET | Freq: Every evening | ORAL | Status: AC | PRN

## 2022-05-22 MED ORDER — PREGABALIN 50 MG PO CAPS: 50.0000 mg | ORAL_CAPSULE | Freq: Three times a day (TID) | ORAL | 0 refills | Status: AC

## 2022-05-22 MED ORDER — MELATONIN 5 MG PO TABS: 5.0000 mg | ORAL_TABLET | Freq: Every evening | ORAL | 0 refills | Status: AC | PRN

## 2022-05-22 NOTE — Discharge Summary (Signed)
Physician Discharge Summary   Patient: Megan Pacheco MRN: [id number]  DOB: 08/04/1985   Admit:     Date of Admission: 05/20/2022 Admitted from: home   Discharge: Date of discharge: 05/22/22 Disposition: Home Condition at discharge: good  CODE STATUS: FULL CODE      Discharge Physician: Emeterio Reeve, DO [facility]     PCP: [place]  Recommendations for Outpatient Follow-up:  Follow up with PCP [place] in 1 weeks Please obtain labs/tests: CBC, CMP, Lipase, consider UA in 1 week Please follow up on the following pending results: none PCP AND OTHER OUTPATIENT PROVIDERS: SEE BELOW FOR SPECIFIC DISCHARGE INSTRUCTIONS PRINTED FOR PATIENT IN ADDITION TO [place] AVS PATIENT INFO    Discharge Instructions     Ambulatory referral to General Surgery   Complete by: As directed    Call MD for:  persistant nausea and vomiting   Complete by: As directed    Call MD for:  severe uncontrolled pain   Complete by: As directed    Diet - low sodium heart healthy   Complete by: As directed    Increase activity slowly   Complete by: As directed          Discharge Diagnoses: Principal Problem:   Intractable nausea and vomiting Active Problems:   Attention deficit hyperactivity disorder (ADHD), combined type   Depression, recurrent ([place])   Anxiety   Diarrhea   Diaphragmatic hernia without obstruction and without gangrene       Hospital Course: Megan Pacheco is a 36 year old female with 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 11/30: VSS. No concerns on CMP. CBC Plt increaswed at 460. Neg UPT. UA no concerns. UDS (+) amphetamines (has Rx dexmethylphenidate filled 11/27 and regularly prior), benzo (has Rx clonazepam filled 11/27 and  regularly prior), TCA (Rx hx for Remeron). ED treatment: Dilaudid 1 mg IV one-time dose, morphine 4 mg one-time dose, Zofran 4 mg x 2, sodium chloride 1 L bolus. EDP consulted [facility] who states that [facility] is completely full and they recommend endoscopy and if endoscopy shows ischemia then the patient can be transferred pending bed availability. EDP consulted GI at [place] who has agreed to take patient to the endoscopy suite for upper endoscopy. EGD no findings. Pt transferred back to medical floor and to remain NPO.  12/01: ok to advance diet but prt reports unable to tolerate. Detailed discussion w/ patient and family at bedside - will confer w/ general surgery but I do not see need for urgent intervention on the diaphragmatic hernia and I am not convinced it is cause of her pain, though may be some scar tissue / adhesions type pain? General surgery confirms no intervention needed urgently, can follow w/ her outpatient surgery team at [facility]. Will trial Lyrica for pain, suspect likely not abdominal/GI per general surgery and per GI  12/02: tolerating diet. Pt comfortable for d/c home w/ pain control and follow w/ outpatient surgeon - referral placed.     Consultants:  Gastroenterology  General Surgery   Procedures: EGD 05/20/2022      ASSESSMENT & PLAN:   Principal Problem:   Intractable nausea and vomiting Active Problems:   Attention deficit hyperactivity disorder (ADHD), combined type   Depression, recurrent (HCC)   Anxiety   Diarrhea   Intractable nausea  and vomiting negative COVID PCR, ordered 20 pathogen respiratory panel (pt refused) - no resp symptoms Concern for chest wall pain vs possible adhesions intraabdominal causing referred pain?  UDS (+)for prescription meds  Stable w/ symptom control No need for surgery  CLD --> advance as tolerated  GI has seen patient  Gen surg has seen patient  Anxiety Chronic use benzodiazepine  Ativan 0.5 mg IV every 6 hours as needed for  anxiety, 3 doses ordered              Discharge Instructions  Allergies as of 05/22/2022       Reactions   Uncaria Tomentosa (cats Claw) Hives, Itching, Swelling        Medication List     TAKE these medications    acetaminophen 500 MG tablet Commonly known as: TYLENOL Take 1-2 tablets (500-1,000 mg total) by mouth every 8 (eight) hours for 5 days. For abdominal pain   Claritin-D 12 Hour 5-120 MG tablet Generic drug: loratadine-pseudoephedrine Take 1 tablet by mouth 2 (two) times daily.   clonazePAM 0.5 MG tablet Commonly known as: KLONOPIN Take 0.5 mg by mouth 2 (two) times daily as needed.   dexmethylphenidate 15 MG 24 hr capsule Commonly known as: FOCALIN XR Take 15 mg by mouth every morning.   doxepin 10 MG capsule Commonly known as: SINEQUAN Take 10 mg by mouth at bedtime as needed.   lidocaine 5 % Commonly known as: LIDODERM Place 2 patches onto the skin daily. Remove & Discard patch within 12 hours or as directed by MD Start taking on: May 23, 2022   melatonin 5 MG Tabs Take 1 tablet (5 mg total) by mouth at bedtime as needed.   ondansetron 4 MG tablet Commonly known as: ZOFRAN Take 1-2 tablets (4-8 mg total) by mouth every 6 (six) hours as needed for nausea or vomiting.   oxyCODONE 5 MG immediate release tablet Commonly known as: Roxicodone Take 1 tablet (5 mg total) by mouth every 6 (six) hours as needed for up to 5 days for severe pain or moderate pain.   pregabalin 50 MG capsule Commonly known as: LYRICA Take 1 capsule (50 mg total) by mouth 3 (three) times daily for 10 days.   senna-docusate 8.6-50 MG tablet Commonly known as: Senokot-S Take 2 tablets by mouth at bedtime as needed for mild constipation or moderate constipation.   Tri-Estarylla 0.18/0.215/0.25 MG-35 MCG tablet Generic drug: Norgestimate-Ethinyl Estradiol Triphasic Take 1 tablet by mouth daily.          Allergies  Allergen Reactions   Uncaria Tomentosa  (Cats Claw) Hives, Itching and Swelling     Subjective: pt reports persistent pain but improved from initial presentation. Tolerating diet    Discharge Exam: BP 107/75 (BP Location: Left Arm)   Pulse 67   Temp 98.2 F (36.8 C) (Oral)   Resp 16   Ht '5\' 7"'$  (1.702 m)   Wt 75.2 kg   SpO2 99%   BMI 25.95 kg/m  General: Pt is alert, awake, not in acute distress Cardiovascular: RRR, S1/S2 +, no rubs, no gallops Respiratory: CTA bilaterally, no wheezing, no rhonchi Abdominal: Soft, TTP LUQ, ND, no rebound, bowel sounds + Extremities: no edema, no cyanosis     The results of significant diagnostics from this hospitalization (including imaging, microbiology, ancillary and laboratory) are listed below for reference.     Microbiology: Recent Results (from the past 240 hour(s))  SARS Coronavirus 2 by RT PCR (hospital order, performed in  [facility] hospital lab) *cepheid single result test* Anterior Nasal Swab     Status: None   Collection Time: 05/20/22  1:19 PM   Specimen: Anterior Nasal Swab  Result Value Ref Range Status   SARS Coronavirus 2 by RT PCR NEGATIVE NEGATIVE Final    Comment: (NOTE) SARS-CoV-2 target nucleic acids are NOT DETECTED.  The SARS-CoV-2 RNA is generally detectable in upper and lower respiratory specimens during the acute phase of infection. The lowest concentration of SARS-CoV-2 viral copies this assay can detect is 250 copies / mL. A negative result does not preclude SARS-CoV-2 infection and should not be used as the sole basis for treatment or other patient management decisions.  A negative result may occur with improper specimen collection / handling, submission of specimen other than nasopharyngeal swab, presence of viral mutation(s) within the areas targeted by this assay, and inadequate number of viral copies (<250 copies / mL). A negative result must be combined with clinical observations, patient history, and epidemiological information.  Fact  Sheet for Patients:   https://www.patel.info/  Fact Sheet for Healthcare Providers: https://hall.com/  This test is not yet approved or  cleared by the [place] FDA and has been authorized for detection and/or diagnosis of SARS-CoV-2 by FDA under an Emergency Use Authorization (EUA).  This EUA will remain in effect (meaning this test can be used) for the duration of the COVID-19 declaration under Section 564(b)(1) of the Act, 21 U.S.C. section 360bbb-3(b)(1), unless the authorization is terminated or revoked sooner.  Performed at [facility], [address] [place], [place] [address]      Labs: BNP (last 3 results) No results for input(s): "BNP" in the last 8760 hours. Basic Metabolic Panel: Recent Labs  Lab 05/20/22 0910 05/21/22 0713  NA 140 139  K 4.1 3.9  CL 108 109  CO2 25 24  GLUCOSE 117* 125*  BUN 14 10  CREATININE 0.91 0.77  CALCIUM 9.5 8.4*  MG 2.0  --   PHOS 3.2  --    Liver Function Tests: Recent Labs  Lab 05/20/22 0910  AST 26  ALT 29  ALKPHOS 64  BILITOT 0.7  PROT 7.9  ALBUMIN 4.0   Recent Labs  Lab 05/20/22 0910  LIPASE 26   No results for input(s): "AMMONIA" in the last 168 hours. CBC: Recent Labs  Lab 05/20/22 0910 05/21/22 0713  WBC 9.7 10.3  HGB 13.3 12.9  HCT 39.8 37.2  MCV 93.2 91.9  PLT 460* 449*   Cardiac Enzymes: No results for input(s): "CKTOTAL", "CKMB", "CKMBINDEX", "TROPONINI" in the last 168 hours. BNP: Invalid input(s): "POCBNP" CBG: No results for input(s): "GLUCAP" in the last 168 hours. D-Dimer No results for input(s): "DDIMER" in the last 72 hours. Hgb A1c No results for input(s): "HGBA1C" in the last 72 hours. Lipid Profile No results for input(s): "CHOL", "HDL", "LDLCALC", "TRIG", "CHOLHDL", "LDLDIRECT" in the last 72 hours. Thyroid function studies No results for input(s): "TSH", "T4TOTAL", "T3FREE", "THYROIDAB" in the last 72 hours.  Invalid  input(s): "FREET3" Anemia work up No results for input(s): "VITAMINB12", "FOLATE", "FERRITIN", "TIBC", "IRON", "RETICCTPCT" in the last 72 hours. Urinalysis    Component Value Date/Time   COLORURINE YELLOW (A) 05/20/2022 0910   APPEARANCEUR HAZY (A) 05/20/2022 0910   APPEARANCEUR Hazy 07/07/2014 1349   LABSPEC 1.025 05/20/2022 0910   LABSPEC 1.006 07/07/2014 1349   PHURINE 5.0 05/20/2022 0910   GLUCOSEU NEGATIVE 05/20/2022 0910   GLUCOSEU Negative 07/07/2014 1349   HGBUR  NEGATIVE 05/20/2022 0910   BILIRUBINUR NEGATIVE 05/20/2022 0910   BILIRUBINUR Negative 07/07/2014 1349   KETONESUR 5 (A) 05/20/2022 0910   PROTEINUR NEGATIVE 05/20/2022 0910   NITRITE NEGATIVE 05/20/2022 0910   LEUKOCYTESUR NEGATIVE 05/20/2022 0910   LEUKOCYTESUR Negative 07/07/2014 1349   Sepsis Labs Recent Labs  Lab 05/20/22 0910 05/21/22 0713  WBC 9.7 10.3   Microbiology Recent Results (from the past 240 hour(s))  SARS Coronavirus 2 by RT PCR (hospital order, performed in [facility] hospital lab) *cepheid single result test* Anterior Nasal Swab     Status: None   Collection Time: 05/20/22  1:19 PM   Specimen: Anterior Nasal Swab  Result Value Ref Range Status   SARS Coronavirus 2 by RT PCR NEGATIVE NEGATIVE Final    Comment: (NOTE) SARS-CoV-2 target nucleic acids are NOT DETECTED.  The SARS-CoV-2 RNA is generally detectable in upper and lower respiratory specimens during the acute phase of infection. The lowest concentration of SARS-CoV-2 viral copies this assay can detect is 250 copies / mL. A negative result does not preclude SARS-CoV-2 infection and should not be used as the sole basis for treatment or other patient management decisions.  A negative result may occur with improper specimen collection / handling, submission of specimen other than nasopharyngeal swab, presence of viral mutation(s) within the areas targeted by this assay, and inadequate number of viral copies (<250 copies / mL). A  negative result must be combined with clinical observations, patient history, and epidemiological information.  Fact Sheet for Patients:   https://www.patel.info/  Fact Sheet for Healthcare Providers: https://hall.com/  This test is not yet approved or  cleared by the [place] FDA and has been authorized for detection and/or diagnosis of SARS-CoV-2 by FDA under an Emergency Use Authorization (EUA).  This EUA will remain in effect (meaning this test can be used) for the duration of the COVID-19 declaration under Section 564(b)(1) of the Act, 21 U.S.C. section 360bbb-3(b)(1), unless the authorization is terminated or revoked sooner.  Performed at [facility], [address], [place], [place] [address]    Imaging CT ABDOMEN PELVIS W CONTRAST  Result Date: 05/20/2022 CLINICAL DATA:  Left upper quadrant abdominal pain. Nausea and vomiting for 2 weeks. EXAM: CT ABDOMEN AND PELVIS WITH CONTRAST TECHNIQUE: Multidetector CT imaging of the abdomen and pelvis was performed using the standard protocol following bolus administration of intravenous contrast. RADIATION DOSE REDUCTION: This exam was performed according to the departmental dose-optimization program which includes automated exposure control, adjustment of the mA and/or kV according to patient size and/or use of iterative reconstruction technique. CONTRAST:  173m OMNIPAQUE IOHEXOL 300 MG/ML  SOLN COMPARISON:  CT AP 12/14/17 FINDINGS: Lower chest: No acute abnormality. Compared to 2019 there is a new diaphragmatic hernia along the posterior margin of the diaphragm on the left with herniation of the gastric fundus through the hernia. Hepatobiliary: Liver has a normal contour. No focal liver lesions are visualized. Portal vein is heterogeneously contrast opacify. Intrahepatic portal veins are normal in appearance. Veins are contrast opacified. There are few layering gallstones without  evidence of cholecystitis. Pancreas: Patient is status post distal pancreatectomy. The remaining portion of the pancreas is normal in appearance. 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 No focal fluid collection is visualized in the splenectomy bed. Adrenals/Urinary Tract: Bilateral adrenal glands are normal in appearance. Bilateral kidneys enhance homogeneously and are normal in in size. No evidence of hydronephrosis. No nephrolithiasis. The urinary bladder is fluid-filled without focal wall thickening. Stomach/Bowel:  The stomach, small bowel, large bowel are normal in caliber without evidence for bowel obstruction. There is diverticulosis without diverticulitis. There is no evidence of focal wall thickening. Appendix is normal in appearance. Vascular/Lymphatic: No significant vascular findings are present. No enlarged abdominal or pelvic lymph nodes. Reproductive: Uterus and bilateral adnexa are unremarkable. Other: No abdominal wall hernia or abnormality. No abdominopelvic ascites. Musculoskeletal: No acute or significant osseous findings. IMPRESSION: New diaphragmatic hernia along the posterior margin of the left hemidiaphragm measuring up to 2.6 cm. Mesenteric fat and a portion of the gastric fundus protrude through the diaphragmatic hernia. Electronically Signed   By: Marin Roberts M.D.   On: 05/20/2022 10:51      Time coordinating discharge: over 30 minutes  SIGNED:  Emeterio Reeve DO [facility]

## 2022-05-22 NOTE — Progress Notes (Signed)
Pt discharged home with significant other in stable condition. Discharge instructions given. Pt verbalized understandng. No immediate questions or concerns at this time. Pt opted to ambulate off the department.

## 2022-05-22 NOTE — Plan of Care (Signed)

## 2022-05-29 ENCOUNTER — Other Ambulatory Visit (HOSPITAL_BASED_OUTPATIENT_CLINIC_OR_DEPARTMENT_OTHER): Payer: Self-pay | Admitting: Osteopathic Medicine

## 2023-02-07 ENCOUNTER — Other Ambulatory Visit: Payer: Self-pay

## 2023-02-07 MED ORDER — DOXEPIN HCL 10 MG PO CAPS
10.0000 mg | ORAL_CAPSULE | Freq: Every evening | ORAL | 2 refills | Status: AC
  Filled 2023-02-07: qty 90, 90d supply, fill #0
  Filled 2023-04-24: qty 90, 90d supply, fill #1
  Filled 2023-09-26: qty 90, 90d supply, fill #2

## 2023-02-14 ENCOUNTER — Ambulatory Visit: Payer: BC Managed Care – PPO | Admitting: Internal Medicine

## 2023-02-22 ENCOUNTER — Other Ambulatory Visit: Payer: Self-pay

## 2023-03-28 ENCOUNTER — Other Ambulatory Visit: Payer: Self-pay

## 2023-03-28 MED ORDER — DEXMETHYLPHENIDATE HCL ER 15 MG PO CP24
15.0000 mg | ORAL_CAPSULE | Freq: Every day | ORAL | 0 refills | Status: AC
  Filled 2023-03-28: qty 30, 30d supply, fill #0

## 2023-03-28 MED ORDER — DEXMETHYLPHENIDATE HCL ER 15 MG PO CP24
15.0000 mg | ORAL_CAPSULE | Freq: Every day | ORAL | 0 refills | Status: DC
  Filled 2023-03-28 – 2023-04-27 (×3): qty 30, 30d supply, fill #0
  Filled ????-??-??: fill #0

## 2023-03-29 ENCOUNTER — Other Ambulatory Visit: Payer: Self-pay

## 2023-03-29 MED ORDER — TRIAMCINOLONE ACETONIDE 0.1 % EX CREA
1.0000 | TOPICAL_CREAM | Freq: Two times a day (BID) | CUTANEOUS | 2 refills | Status: AC
  Filled 2023-03-29 (×3): qty 30, 30d supply, fill #0
  Filled 2023-04-24: qty 30, 30d supply, fill #1
  Filled 2023-06-27: qty 30, 30d supply, fill #2

## 2023-04-08 ENCOUNTER — Ambulatory Visit: Payer: BC Managed Care – PPO | Admitting: Internal Medicine

## 2023-04-21 ENCOUNTER — Ambulatory Visit: Payer: 59 | Admitting: Internal Medicine

## 2023-04-24 ENCOUNTER — Other Ambulatory Visit: Payer: Self-pay

## 2023-04-27 ENCOUNTER — Other Ambulatory Visit: Payer: Self-pay

## 2023-05-03 ENCOUNTER — Other Ambulatory Visit: Payer: Self-pay

## 2023-05-03 MED ORDER — CLONAZEPAM 0.5 MG PO TABS
0.5000 mg | ORAL_TABLET | Freq: Two times a day (BID) | ORAL | 0 refills | Status: DC | PRN
  Filled 2023-05-03: qty 30, 15d supply, fill #0

## 2023-05-09 ENCOUNTER — Other Ambulatory Visit: Payer: Self-pay

## 2023-05-09 MED ORDER — QUETIAPINE FUMARATE ER 50 MG PO TB24
50.0000 mg | ORAL_TABLET | Freq: Every evening | ORAL | 0 refills | Status: DC
  Filled 2023-05-09: qty 90, 90d supply, fill #0

## 2023-05-16 ENCOUNTER — Other Ambulatory Visit: Payer: Self-pay

## 2023-05-16 MED ORDER — PREGABALIN 50 MG PO CAPS
50.0000 mg | ORAL_CAPSULE | Freq: Three times a day (TID) | ORAL | 2 refills | Status: DC | PRN
  Filled 2023-05-16: qty 90, 30d supply, fill #0
  Filled 2023-06-23: qty 90, 30d supply, fill #1
  Filled 2023-07-27: qty 90, 30d supply, fill #2

## 2023-05-30 ENCOUNTER — Ambulatory Visit
Admission: EM | Admit: 2023-05-30 | Discharge: 2023-05-30 | Disposition: A | Discharge status: Home / Self Care | Payer: 59

## 2023-05-30 ENCOUNTER — Other Ambulatory Visit: Payer: Self-pay

## 2023-05-30 DIAGNOSIS — J4521 Mild intermittent asthma with (acute) exacerbation: Secondary | ICD-10-CM

## 2023-05-30 MED ORDER — CLONAZEPAM 0.5 MG PO TABS
0.5000 mg | ORAL_TABLET | Freq: Two times a day (BID) | ORAL | 0 refills | Status: DC | PRN
  Filled 2023-05-30: qty 30, 15d supply, fill #0

## 2023-05-30 MED ORDER — DEXMETHYLPHENIDATE HCL ER 15 MG PO CP24
15.0000 mg | ORAL_CAPSULE | Freq: Every day | ORAL | 0 refills | Status: DC
  Filled 2023-05-30: qty 30, 30d supply, fill #0

## 2023-05-30 MED ORDER — PREDNISONE 10 MG PO TABS
40.0000 mg | ORAL_TABLET | Freq: Every day | ORAL | 0 refills | Status: AC
  Filled 2023-05-30: qty 20, 5d supply, fill #0

## 2023-05-30 MED ORDER — ALBUTEROL SULFATE HFA 108 (90 BASE) MCG/ACT IN AERS
1.0000 | INHALATION_SPRAY | Freq: Four times a day (QID) | RESPIRATORY_TRACT | 0 refills | Status: DC | PRN
  Filled 2023-05-30: qty 6.7, 25d supply, fill #0

## 2023-05-30 NOTE — Discharge Instructions (Addendum)
Use the albuterol inhaler and take the prednisone as directed.  Follow up with your primary care provider.   ° °

## 2023-05-30 NOTE — ED Triage Notes (Addendum)
Patient to Urgent Care with complaints of cough/ nasal and sinus congestion/ shortness of breath at rest and w/ exertion. Congestion in her chest. Possible fevers over the weekend.   Reports symptoms started Friday. Multiple sick contacts. Reports hx of reactive airway disease and used an expired albuterol inhaler over the weekend.   Taking otc cold and flu meds.

## 2023-05-30 NOTE — ED Provider Notes (Signed)
Renaldo Fiddler    CSN: [id number] Arrival date & time: 05/30/23  1339      History   Chief Complaint Chief Complaint  Patient presents with   Nasal Congestion   Cough   Shortness of Breath    HPI Megan Pacheco is a 37 y.o. female.  Patient presents with 3 to 4-day history of congestion, cough, shortness of breath.  Treatment attempted with OTC cold medication and use of expired albuterol inhaler (expired in 2018).  No fever, chest pain, vomiting, diarrhea, or other symptoms.  Patient reports history of reactive airway disease but has not required treatment in several years.  The history is provided by the patient and medical records.    Past Medical History:  Diagnosis Date   ADHD    Fatigue    Insomnia    PTSD (post-traumatic stress disorder)    Vitamin D deficiency     Patient Active Problem List   Diagnosis Date Noted   Diaphragmatic hernia without obstruction and without gangrene 05/21/2022   Intractable nausea and vomiting 05/20/2022   Diarrhea 05/20/2022   Encounter to establish care 12/25/2021   Overweight (BMI 25.0-29.9) 12/25/2021   Chronic fatigue 12/25/2021   Allergic rhinitis due to animal hair and dander 12/25/2021   Attention deficit hyperactivity disorder (ADHD), combined type 12/25/2021   Depression, recurrent (HCC) 12/25/2021   Anxiety 12/25/2021   PTSD (post-traumatic stress disorder) 12/25/2021   Primary insomnia 12/25/2021   Vomiting 12/14/2017    Past Surgical History:  Procedure Laterality Date   ESOPHAGOGASTRODUODENOSCOPY (EGD) WITH PROPOFOL N/A 05/20/2022   Procedure: ESOPHAGOGASTRODUODENOSCOPY (EGD) WITH PROPOFOL;  Surgeon: Midge Minium, MD;  Location: [facility] ENDOSCOPY;  Service: Endoscopy;  Laterality: N/A;   PANCREAS SURGERY     spleen ectomy      OB History   No obstetric history on file.      Home Medications    Prior to Admission medications   Medication Sig Start Date End Date Taking? Authorizing Provider   albuterol (VENTOLIN HFA) 108 (90 Base) MCG/ACT inhaler Inhale 1-2 puffs into the lungs every 6 (six) hours as needed. 05/30/23  Yes Mickie Bail, NP  predniSONE (DELTASONE) 10 MG tablet Take 4 tablets (40 mg total) by mouth daily for 5 days. 05/30/23 06/04/23 Yes Mickie Bail, NP  clonazePAM (KLONOPIN) 0.5 MG tablet Take 0.5 mg by mouth 2 (two) times daily as needed. 10/19/21   [provider]  clonazePAM (KLONOPIN) 0.5 MG tablet Take 1 tablet (0.5 mg total) by mouth 2 (two) times daily as needed. 05/03/23     dexmethylphenidate (FOCALIN XR) 15 MG 24 hr capsule Take 15 mg by mouth every morning. 05/17/22   [provider]  dexmethylphenidate (FOCALIN XR) 15 MG 24 hr capsule Take 1 capsule (15 mg total) by mouth daily with breakfast for ADHD 03/28/23     dexmethylphenidate (FOCALIN XR) 15 MG 24 hr capsule Take 1 capsule (15 mg total) by mouth daily with breakfast. 03/28/23     doxepin (SINEQUAN) 10 MG capsule Take 10 mg by mouth at bedtime as needed. 05/06/22   [provider]  doxepin (SINEQUAN) 10 MG capsule Take 1 capsule (10 mg total) by mouth at bedtime. 02/07/23     lidocaine (LIDODERM) 5 % Place 2 patches onto the skin daily. Remove & Discard patch within 12 hours or as directed by MD 05/23/22   Sunnie Nielsen, DO  loratadine-pseudoephedrine (CLARITIN-D 12 HOUR) 5-120 MG tablet Take 1 tablet by mouth  2 (two) times daily.    [provider]  melatonin 5 MG TABS Take 1 tablet (5 mg total) by mouth at bedtime as needed. 05/22/22   Sunnie Nielsen, DO  ondansetron (ZOFRAN) 4 MG tablet Take 1-2 tablets (4-8 mg total) by mouth every 6 (six) hours as needed for nausea or vomiting. 05/22/22   Sunnie Nielsen, DO  pregabalin (LYRICA) 50 MG capsule Take 1 capsule (50 mg total) by mouth 3 (three) times daily for 10 days. 05/22/22 06/01/22  Sunnie Nielsen, DO  pregabalin (LYRICA) 50 MG capsule Take 1 capsule (50 mg total) by mouth 3 (three) times daily as needed.  05/16/23     QUEtiapine (SEROQUEL XR) 50 MG TB24 24 hr tablet Take 1 tablet (50 mg total) by mouth at bedtime. 05/09/23     senna-docusate (SENOKOT-S) 8.6-50 MG tablet Take 2 tablets by mouth at bedtime as needed for mild constipation or moderate constipation. 05/22/22   Sunnie Nielsen, DO  TRI-ESTARYLLA 0.18/0.215/0.25 MG-35 MCG tablet Take 1 tablet by mouth daily. 10/06/21   [provider]  triamcinolone cream (KENALOG) 0.1 % Apply 1 Application topically 2 (two) times daily for up to 2 weeks for rash 03/28/23       Family History Family History  Problem Relation Age of Onset   Diabetes Father    Hyperthyroidism Father    Heart disease Father    Post-traumatic stress disorder Brother    Liver cancer Maternal Grandmother    Diabetes Paternal Grandfather     Social History Social History   Tobacco Use   Smoking status: Never   Smokeless tobacco: Never  Vaping Use   Vaping status: Some Days  Substance Use Topics   Alcohol use: Not Currently   Drug use: Never     Allergies   Uncaria tomentosa (cats claw)   Review of Systems Review of Systems  Constitutional:  Negative for chills and fever.  HENT:  Positive for congestion. Negative for ear pain and sore throat.   Respiratory:  Positive for cough and shortness of breath.   Cardiovascular:  Negative for chest pain and palpitations.     Physical Exam Triage Vital Signs ED Triage Vitals  Encounter Vitals Group     BP 05/30/23 1407 128/86     Systolic BP Percentile --      Diastolic BP Percentile --      Pulse Rate 05/30/23 1407 80     Resp 05/30/23 1407 18     Temp 05/30/23 1407 98.3 F (36.8 C)     Temp src --      SpO2 05/30/23 1407 96 %     Weight --      Height --      Head Circumference --      Peak Flow --      Pain Score 05/30/23 1359 7     Pain Loc --      Pain Education --      Exclude from Growth Chart --    No data found.  Updated Vital Signs BP 128/86   Pulse 80   Temp 98.3 F  (36.8 C)   Resp 18   LMP 05/30/2023   SpO2 96%   Visual Acuity Right Eye Distance:   Left Eye Distance:   Bilateral Distance:    Right Eye Near:   Left Eye Near:    Bilateral Near:     Physical Exam Constitutional:      General: She is not in  acute distress. HENT:     Right Ear: Tympanic membrane normal.     Left Ear: Tympanic membrane normal.     Nose: Nose normal.     Mouth/Throat:     Mouth: Mucous membranes are moist.     Pharynx: Oropharynx is clear.  Cardiovascular:     Rate and Rhythm: Normal rate and regular rhythm.     Heart sounds: Normal heart sounds.  Pulmonary:     Effort: Pulmonary effort is normal. No respiratory distress.     Breath sounds: Normal breath sounds.  Skin:    General: Skin is warm and dry.  Neurological:     Mental Status: She is alert.      UC Treatments / Results  Labs (all labs ordered are listed, but only abnormal results are displayed) Labs Reviewed - No data to display  EKG   Radiology No results found.  Procedures Procedures (including critical care time)  Medications Ordered in UC Medications - No data to display  Initial Impression / Assessment and Plan / UC Course  I have reviewed the triage vital signs and the nursing notes.  Pertinent labs & imaging results that were available during my care of the patient were reviewed by me and considered in my medical decision making (see chart for details).    Asthma exacerbation.  Afebrile and vital signs are stable.  Lungs are clear at this time and O2 sat is 96% on room air.  No respiratory distress.  Patient declines COVID test as she had a negative test result at home.  Treating with albuterol inhaler and prednisone.  Education provided on asthma.  Instructed patient to follow-up with her PCP.  ED precautions given.  She agrees to plan of care.  Final Clinical Impressions(s) / UC Diagnoses   Final diagnoses:  Mild intermittent asthma with acute exacerbation      Discharge Instructions      Use the albuterol inhaler and take the prednisone as directed.  Follow-up with your primary care provider.     ED Prescriptions     Medication Sig Dispense Auth. Provider   albuterol (VENTOLIN HFA) 108 (90 Base) MCG/ACT inhaler Inhale 1-2 puffs into the lungs every 6 (six) hours as needed. 18 g Mickie Bail, NP   predniSONE (DELTASONE) 10 MG tablet Take 4 tablets (40 mg total) by mouth daily for 5 days. 20 tablet Mickie Bail, NP      PDMP not reviewed this encounter.   Mickie Bail, NP 05/30/23 [phone]

## 2023-05-31 ENCOUNTER — Other Ambulatory Visit: Payer: Self-pay

## 2023-05-31 ENCOUNTER — Ambulatory Visit: Payer: Self-pay

## 2023-06-23 ENCOUNTER — Other Ambulatory Visit: Payer: Self-pay

## 2023-06-23 ENCOUNTER — Other Ambulatory Visit (HOSPITAL_COMMUNITY): Payer: Self-pay

## 2023-06-23 ENCOUNTER — Other Ambulatory Visit: Payer: Self-pay | Admitting: Emergency Medicine

## 2023-06-24 ENCOUNTER — Other Ambulatory Visit: Payer: Self-pay

## 2023-06-26 ENCOUNTER — Other Ambulatory Visit: Payer: Self-pay

## 2023-06-26 MED ORDER — DEXMETHYLPHENIDATE HCL ER 15 MG PO CP24
15.0000 mg | ORAL_CAPSULE | Freq: Every day | ORAL | 0 refills | Status: DC
  Filled 2023-06-27: qty 30, 30d supply, fill #0

## 2023-06-26 MED ORDER — QUETIAPINE FUMARATE ER 50 MG PO TB24
50.0000 mg | ORAL_TABLET | Freq: Every evening | ORAL | 0 refills | Status: DC
  Filled 2023-06-26 – 2023-07-27 (×2): qty 90, 90d supply, fill #0

## 2023-06-26 MED ORDER — CLONAZEPAM 0.5 MG PO TABS
0.5000 mg | ORAL_TABLET | Freq: Two times a day (BID) | ORAL | 0 refills | Status: AC | PRN
  Filled 2023-06-26: qty 30, 15d supply, fill #0

## 2023-06-27 ENCOUNTER — Other Ambulatory Visit: Payer: Self-pay

## 2023-06-28 ENCOUNTER — Other Ambulatory Visit: Payer: Self-pay

## 2023-07-04 ENCOUNTER — Other Ambulatory Visit: Payer: Self-pay

## 2023-07-04 MED ORDER — ALBUTEROL SULFATE HFA 108 (90 BASE) MCG/ACT IN AERS
1.0000 | INHALATION_SPRAY | Freq: Four times a day (QID) | RESPIRATORY_TRACT | 2 refills | Status: AC | PRN
  Filled 2023-07-04 – 2023-09-26 (×2): qty 6.7, 25d supply, fill #0
  Filled 2024-04-11: qty 6.7, 25d supply, fill #1

## 2023-07-05 ENCOUNTER — Other Ambulatory Visit: Payer: Self-pay

## 2023-07-22 ENCOUNTER — Other Ambulatory Visit: Payer: Self-pay

## 2023-07-27 ENCOUNTER — Other Ambulatory Visit: Payer: Self-pay

## 2023-07-28 ENCOUNTER — Other Ambulatory Visit: Payer: Self-pay

## 2023-07-29 ENCOUNTER — Other Ambulatory Visit: Payer: Self-pay

## 2023-07-29 MED ORDER — DEXMETHYLPHENIDATE HCL ER 15 MG PO CP24
15.0000 mg | ORAL_CAPSULE | Freq: Every day | ORAL | 0 refills | Status: DC
  Filled 2023-07-29: qty 30, 30d supply, fill #0

## 2023-08-24 ENCOUNTER — Other Ambulatory Visit: Payer: Self-pay

## 2023-08-25 ENCOUNTER — Other Ambulatory Visit: Payer: Self-pay

## 2023-08-29 ENCOUNTER — Other Ambulatory Visit: Payer: Self-pay

## 2023-08-30 ENCOUNTER — Other Ambulatory Visit: Payer: Self-pay

## 2023-08-31 ENCOUNTER — Other Ambulatory Visit: Payer: Self-pay

## 2023-08-31 MED ORDER — DEXMETHYLPHENIDATE HCL ER 15 MG PO CP24
15.0000 mg | ORAL_CAPSULE | Freq: Every day | ORAL | 0 refills | Status: DC
  Filled 2023-08-31: qty 30, 30d supply, fill #0

## 2023-09-26 ENCOUNTER — Other Ambulatory Visit: Payer: Self-pay

## 2023-09-27 ENCOUNTER — Other Ambulatory Visit: Payer: Self-pay

## 2023-09-27 MED ORDER — CLONAZEPAM 0.5 MG PO TABS
0.5000 mg | ORAL_TABLET | Freq: Two times a day (BID) | ORAL | 0 refills | Status: DC | PRN
  Filled 2023-09-27: qty 30, 15d supply, fill #0

## 2023-09-27 MED ORDER — QUETIAPINE FUMARATE ER 50 MG PO TB24
50.0000 mg | ORAL_TABLET | Freq: Every evening | ORAL | 0 refills | Status: AC
  Filled 2023-09-27 – 2023-12-05 (×3): qty 90, 90d supply, fill #0

## 2023-09-28 ENCOUNTER — Other Ambulatory Visit: Payer: Self-pay

## 2023-09-29 ENCOUNTER — Other Ambulatory Visit: Payer: Self-pay

## 2023-09-30 ENCOUNTER — Other Ambulatory Visit: Payer: Self-pay

## 2023-10-03 ENCOUNTER — Other Ambulatory Visit: Payer: Self-pay

## 2023-10-03 DIAGNOSIS — R41840 Attention and concentration deficit: Secondary | ICD-10-CM | POA: Diagnosis not present

## 2023-10-03 DIAGNOSIS — F331 Major depressive disorder, recurrent, moderate: Secondary | ICD-10-CM | POA: Diagnosis not present

## 2023-10-03 DIAGNOSIS — F900 Attention-deficit hyperactivity disorder, predominantly inattentive type: Secondary | ICD-10-CM | POA: Diagnosis not present

## 2023-10-03 DIAGNOSIS — F4312 Post-traumatic stress disorder, chronic: Secondary | ICD-10-CM | POA: Diagnosis not present

## 2023-10-03 DIAGNOSIS — F4011 Social phobia, generalized: Secondary | ICD-10-CM | POA: Diagnosis not present

## 2023-10-03 DIAGNOSIS — F5105 Insomnia due to other mental disorder: Secondary | ICD-10-CM | POA: Diagnosis not present

## 2023-10-03 DIAGNOSIS — F410 Panic disorder [episodic paroxysmal anxiety] without agoraphobia: Secondary | ICD-10-CM | POA: Diagnosis not present

## 2023-10-03 DIAGNOSIS — Z1389 Encounter for screening for other disorder: Secondary | ICD-10-CM | POA: Diagnosis not present

## 2023-10-03 MED ORDER — DEXMETHYLPHENIDATE HCL ER 15 MG PO CP24
15.0000 mg | ORAL_CAPSULE | Freq: Every day | ORAL | 0 refills | Status: DC
  Filled 2023-10-31 – 2023-12-01 (×2): qty 30, 30d supply, fill #0

## 2023-10-03 MED ORDER — TRAZODONE HCL 100 MG PO TABS
100.0000 mg | ORAL_TABLET | Freq: Every evening | ORAL | 0 refills | Status: DC | PRN
  Filled 2023-10-03: qty 30, 30d supply, fill #0

## 2023-10-03 MED ORDER — PREGABALIN 50 MG PO CAPS
50.0000 mg | ORAL_CAPSULE | Freq: Three times a day (TID) | ORAL | 2 refills | Status: DC | PRN
  Filled 2023-10-03: qty 90, 30d supply, fill #0
  Filled 2023-10-28 – 2023-11-16 (×2): qty 90, 30d supply, fill #1
  Filled 2023-12-01 – 2023-12-29 (×9): qty 90, 30d supply, fill #2

## 2023-10-03 MED ORDER — DEXMETHYLPHENIDATE HCL ER 15 MG PO CP24
15.0000 mg | ORAL_CAPSULE | Freq: Every day | ORAL | 0 refills | Status: AC
  Filled 2023-10-28 – 2023-10-31 (×2): qty 30, 30d supply, fill #0

## 2023-10-03 MED ORDER — VENLAFAXINE HCL ER 75 MG PO CP24
75.0000 mg | ORAL_CAPSULE | Freq: Every day | ORAL | 0 refills | Status: DC
  Filled 2023-10-03: qty 30, 30d supply, fill #0

## 2023-10-03 MED ORDER — DEXMETHYLPHENIDATE HCL ER 15 MG PO CP24
15.0000 mg | ORAL_CAPSULE | Freq: Every day | ORAL | 0 refills | Status: AC
  Filled 2023-10-03: qty 30, 30d supply, fill #0

## 2023-10-03 MED ORDER — CLONAZEPAM 0.5 MG PO TABS
0.5000 mg | ORAL_TABLET | Freq: Two times a day (BID) | ORAL | 0 refills | Status: DC | PRN
  Filled 2023-12-09 – 2023-12-29 (×2): qty 30, 15d supply, fill #0

## 2023-10-03 MED ORDER — CLONAZEPAM 0.5 MG PO TABS
0.5000 mg | ORAL_TABLET | Freq: Every day | ORAL | 0 refills | Status: AC | PRN
  Filled 2023-10-03 – 2023-11-16 (×2): qty 5, 5d supply, fill #0

## 2023-10-10 ENCOUNTER — Other Ambulatory Visit
Admission: RE | Admit: 2023-10-10 | Discharge: 2023-10-10 | Disposition: A | Discharge status: Home / Self Care | Attending: Psychiatry | Admitting: Psychiatry

## 2023-10-10 DIAGNOSIS — F4312 Post-traumatic stress disorder, chronic: Secondary | ICD-10-CM | POA: Insufficient documentation

## 2023-10-10 DIAGNOSIS — F411 Generalized anxiety disorder: Secondary | ICD-10-CM | POA: Insufficient documentation

## 2023-10-10 DIAGNOSIS — F331 Major depressive disorder, recurrent, moderate: Secondary | ICD-10-CM | POA: Diagnosis not present

## 2023-10-10 LAB — VITAMIN B12: Vitamin B-12: 231 pg/mL (ref 180–914)

## 2023-10-10 LAB — FOLATE: Folate: 11 ng/mL (ref >5.9)

## 2023-10-11 LAB — URINE DRUGS OF ABUSE SCREEN W ALC, ROUTINE (REF LAB)
Amphetamines, Urine: NEGATIVE ng/mL
Barbiturate, Ur: NEGATIVE ng/mL
Benzodiazepine Quant, Ur: NEGATIVE ng/mL
Cannabinoid Quant, Ur: NEGATIVE ng/mL
Cocaine (Metab.): NEGATIVE ng/mL
Creatinine, Urine: 121.9 mg/dL (ref 20.0–300.0)
Ethanol U, Quan: NEGATIVE %
Methadone Screen, Urine: NEGATIVE ng/mL
Nitrite Urine, Quantitative: NEGATIVE ug/mL
OPIATE SCREEN URINE: NEGATIVE ng/mL
Phencyclidine, Ur: NEGATIVE ng/mL
Propoxyphene, Urine: NEGATIVE ng/mL
pH, Urine: 6.8 (ref 4.5–8.9)

## 2023-10-21 ENCOUNTER — Other Ambulatory Visit: Payer: Self-pay

## 2023-10-21 DIAGNOSIS — F331 Major depressive disorder, recurrent, moderate: Secondary | ICD-10-CM | POA: Diagnosis not present

## 2023-10-21 DIAGNOSIS — F410 Panic disorder [episodic paroxysmal anxiety] without agoraphobia: Secondary | ICD-10-CM | POA: Diagnosis not present

## 2023-10-21 DIAGNOSIS — F4312 Post-traumatic stress disorder, chronic: Secondary | ICD-10-CM | POA: Diagnosis not present

## 2023-10-21 DIAGNOSIS — F4011 Social phobia, generalized: Secondary | ICD-10-CM | POA: Diagnosis not present

## 2023-10-21 DIAGNOSIS — R41840 Attention and concentration deficit: Secondary | ICD-10-CM | POA: Diagnosis not present

## 2023-10-21 DIAGNOSIS — F5105 Insomnia due to other mental disorder: Secondary | ICD-10-CM | POA: Diagnosis not present

## 2023-10-21 MED ORDER — VENLAFAXINE HCL ER 75 MG PO CP24
75.0000 mg | ORAL_CAPSULE | Freq: Every day | ORAL | 0 refills | Status: DC
  Filled 2023-10-21 – 2023-10-28 (×2): qty 90, 90d supply, fill #0

## 2023-10-21 MED ORDER — TRAZODONE HCL 100 MG PO TABS
100.0000 mg | ORAL_TABLET | Freq: Every evening | ORAL | 0 refills | Status: DC | PRN
  Filled 2023-10-21 – 2023-10-28 (×2): qty 90, 90d supply, fill #0

## 2023-10-28 ENCOUNTER — Other Ambulatory Visit: Payer: Self-pay

## 2023-10-31 ENCOUNTER — Other Ambulatory Visit: Payer: Self-pay

## 2023-11-16 ENCOUNTER — Other Ambulatory Visit: Payer: Self-pay

## 2023-11-16 MED ORDER — ONDANSETRON HCL 8 MG PO TABS
8.0000 mg | ORAL_TABLET | Freq: Three times a day (TID) | ORAL | 1 refills | Status: AC | PRN
  Filled 2023-11-16: qty 30, 10d supply, fill #0

## 2023-11-18 ENCOUNTER — Other Ambulatory Visit: Payer: Self-pay

## 2023-11-18 DIAGNOSIS — F4312 Post-traumatic stress disorder, chronic: Secondary | ICD-10-CM | POA: Diagnosis not present

## 2023-11-18 DIAGNOSIS — F4011 Social phobia, generalized: Secondary | ICD-10-CM | POA: Diagnosis not present

## 2023-11-18 DIAGNOSIS — F331 Major depressive disorder, recurrent, moderate: Secondary | ICD-10-CM | POA: Diagnosis not present

## 2023-11-18 DIAGNOSIS — R41840 Attention and concentration deficit: Secondary | ICD-10-CM | POA: Diagnosis not present

## 2023-11-18 DIAGNOSIS — F410 Panic disorder [episodic paroxysmal anxiety] without agoraphobia: Secondary | ICD-10-CM | POA: Diagnosis not present

## 2023-11-18 DIAGNOSIS — F5105 Insomnia due to other mental disorder: Secondary | ICD-10-CM | POA: Diagnosis not present

## 2023-11-18 MED ORDER — CYANOCOBALAMIN 1000 MCG/ML IJ SOLN
1000.0000 ug | INTRAMUSCULAR | 0 refills | Status: AC
  Filled 2023-12-01: qty 4, fill #0
  Filled 2023-12-02 – 2023-12-29 (×7): qty 4, 28d supply, fill #0

## 2023-11-18 MED ORDER — TRAZODONE HCL 100 MG PO TABS
100.0000 mg | ORAL_TABLET | Freq: Every evening | ORAL | 0 refills | Status: AC | PRN
  Filled 2023-11-18 – 2024-01-26 (×8): qty 90, 90d supply, fill #0

## 2023-11-18 MED ORDER — CYANOCOBALAMIN 1000 MCG/ML IJ SOLN
1000.0000 ug | INTRAMUSCULAR | 0 refills | Status: AC
  Filled 2023-11-18: qty 4, 28d supply, fill #0

## 2023-11-18 MED ORDER — VENLAFAXINE HCL ER 75 MG PO CP24
75.0000 mg | ORAL_CAPSULE | Freq: Every day | ORAL | 0 refills | Status: AC
  Filled 2023-11-18 – 2024-01-26 (×9): qty 90, 90d supply, fill #0

## 2023-12-02 ENCOUNTER — Other Ambulatory Visit: Payer: Self-pay

## 2023-12-03 ENCOUNTER — Other Ambulatory Visit: Payer: Self-pay

## 2023-12-04 ENCOUNTER — Other Ambulatory Visit: Payer: Self-pay

## 2023-12-05 ENCOUNTER — Other Ambulatory Visit: Payer: Self-pay

## 2023-12-09 ENCOUNTER — Other Ambulatory Visit: Payer: Self-pay

## 2023-12-12 ENCOUNTER — Other Ambulatory Visit: Payer: Self-pay

## 2023-12-15 ENCOUNTER — Other Ambulatory Visit: Payer: Self-pay

## 2023-12-20 ENCOUNTER — Other Ambulatory Visit: Payer: Self-pay

## 2023-12-26 ENCOUNTER — Other Ambulatory Visit: Payer: Self-pay

## 2023-12-29 ENCOUNTER — Other Ambulatory Visit: Payer: Self-pay

## 2023-12-30 ENCOUNTER — Other Ambulatory Visit: Payer: Self-pay

## 2023-12-30 MED ORDER — DEXMETHYLPHENIDATE HCL ER 15 MG PO CP24
15.0000 mg | ORAL_CAPSULE | Freq: Every day | ORAL | 0 refills | Status: DC
  Filled 2024-01-02: qty 30, 30d supply, fill #0

## 2024-01-02 ENCOUNTER — Other Ambulatory Visit: Payer: Self-pay

## 2024-01-26 ENCOUNTER — Other Ambulatory Visit: Payer: Self-pay

## 2024-01-27 ENCOUNTER — Other Ambulatory Visit: Payer: Self-pay

## 2024-01-30 ENCOUNTER — Other Ambulatory Visit: Payer: Self-pay

## 2024-01-30 MED ORDER — CLONAZEPAM 0.5 MG PO TABS
0.5000 mg | ORAL_TABLET | Freq: Two times a day (BID) | ORAL | 0 refills | Status: DC | PRN
  Filled 2024-01-30: qty 30, 15d supply, fill #0

## 2024-01-31 ENCOUNTER — Other Ambulatory Visit: Payer: Self-pay

## 2024-02-02 ENCOUNTER — Other Ambulatory Visit: Payer: Self-pay

## 2024-02-03 ENCOUNTER — Other Ambulatory Visit: Payer: Self-pay

## 2024-02-06 ENCOUNTER — Other Ambulatory Visit: Payer: Self-pay

## 2024-02-07 ENCOUNTER — Other Ambulatory Visit: Payer: Self-pay

## 2024-02-08 ENCOUNTER — Other Ambulatory Visit: Payer: Self-pay

## 2024-02-08 DIAGNOSIS — F4310 Post-traumatic stress disorder, unspecified: Secondary | ICD-10-CM | POA: Diagnosis not present

## 2024-02-08 DIAGNOSIS — F331 Major depressive disorder, recurrent, moderate: Secondary | ICD-10-CM | POA: Diagnosis not present

## 2024-02-08 DIAGNOSIS — Z1389 Encounter for screening for other disorder: Secondary | ICD-10-CM | POA: Diagnosis not present

## 2024-02-08 DIAGNOSIS — F900 Attention-deficit hyperactivity disorder, predominantly inattentive type: Secondary | ICD-10-CM | POA: Diagnosis not present

## 2024-02-08 MED ORDER — DEXMETHYLPHENIDATE HCL ER 15 MG PO CP24
15.0000 mg | ORAL_CAPSULE | Freq: Every day | ORAL | 0 refills | Status: DC
  Filled 2024-02-08: qty 10, 10d supply, fill #0
  Filled 2024-02-08: qty 20, 20d supply, fill #0

## 2024-02-08 MED ORDER — DEXMETHYLPHENIDATE HCL ER 15 MG PO CP24
15.0000 mg | ORAL_CAPSULE | Freq: Every day | ORAL | 0 refills | Status: AC
  Filled 2024-03-11: qty 30, 30d supply, fill #0

## 2024-02-08 MED ORDER — PREGABALIN 50 MG PO CAPS
50.0000 mg | ORAL_CAPSULE | Freq: Three times a day (TID) | ORAL | 2 refills | Status: DC | PRN
  Filled 2024-02-08: qty 90, 30d supply, fill #0
  Filled 2024-03-10: qty 90, 30d supply, fill #1
  Filled 2024-04-11: qty 90, 30d supply, fill #2

## 2024-02-08 MED ORDER — DEXMETHYLPHENIDATE HCL ER 15 MG PO CP24
15.0000 mg | ORAL_CAPSULE | Freq: Every day | ORAL | 0 refills | Status: AC
  Filled 2024-03-10 – 2024-04-11 (×2): qty 30, 30d supply, fill #0

## 2024-03-10 ENCOUNTER — Other Ambulatory Visit: Payer: Self-pay

## 2024-03-11 ENCOUNTER — Other Ambulatory Visit: Payer: Self-pay

## 2024-03-12 ENCOUNTER — Other Ambulatory Visit: Payer: Self-pay

## 2024-03-15 ENCOUNTER — Other Ambulatory Visit: Payer: Self-pay

## 2024-03-16 ENCOUNTER — Other Ambulatory Visit: Payer: Self-pay

## 2024-03-19 ENCOUNTER — Other Ambulatory Visit: Payer: Self-pay

## 2024-04-03 ENCOUNTER — Other Ambulatory Visit: Payer: Self-pay

## 2024-04-04 ENCOUNTER — Other Ambulatory Visit: Payer: Self-pay

## 2024-04-05 ENCOUNTER — Other Ambulatory Visit: Payer: Self-pay

## 2024-04-09 ENCOUNTER — Other Ambulatory Visit: Payer: Self-pay

## 2024-04-09 MED ORDER — CLONAZEPAM 0.5 MG PO TABS
0.5000 mg | ORAL_TABLET | Freq: Two times a day (BID) | ORAL | 1 refills | Status: AC | PRN
  Filled 2024-04-09: qty 30, 15d supply, fill #0
  Filled 2024-05-14: qty 30, 15d supply, fill #1

## 2024-04-11 ENCOUNTER — Other Ambulatory Visit: Payer: Self-pay

## 2024-04-12 ENCOUNTER — Other Ambulatory Visit: Payer: Self-pay

## 2024-04-13 ENCOUNTER — Other Ambulatory Visit: Payer: Self-pay

## 2024-04-16 ENCOUNTER — Other Ambulatory Visit: Payer: Self-pay

## 2024-04-29 ENCOUNTER — Other Ambulatory Visit: Payer: Self-pay

## 2024-04-30 ENCOUNTER — Other Ambulatory Visit: Payer: Self-pay

## 2024-05-01 ENCOUNTER — Other Ambulatory Visit: Payer: Self-pay

## 2024-05-02 ENCOUNTER — Other Ambulatory Visit: Payer: Self-pay

## 2024-05-06 ENCOUNTER — Other Ambulatory Visit: Payer: Self-pay

## 2024-05-07 ENCOUNTER — Other Ambulatory Visit: Payer: Self-pay

## 2024-05-07 MED ORDER — TRAZODONE HCL 100 MG PO TABS
100.0000 mg | ORAL_TABLET | Freq: Every day | ORAL | 2 refills | Status: AC
  Filled 2024-05-07: qty 90, 90d supply, fill #0
  Filled 2024-05-14 – 2024-07-11 (×4): qty 90, 90d supply, fill #1

## 2024-05-11 ENCOUNTER — Other Ambulatory Visit: Payer: Self-pay

## 2024-05-14 ENCOUNTER — Other Ambulatory Visit: Payer: Self-pay

## 2024-05-14 MED ORDER — PREGABALIN 50 MG PO CAPS
50.0000 mg | ORAL_CAPSULE | Freq: Three times a day (TID) | ORAL | 2 refills | Status: AC | PRN
  Filled 2024-05-14: qty 90, 30d supply, fill #0
  Filled 2024-06-13: qty 90, 30d supply, fill #1
  Filled 2024-07-10 – 2024-07-17 (×3): qty 90, 30d supply, fill #2

## 2024-05-14 MED ORDER — DEXMETHYLPHENIDATE HCL ER 15 MG PO CP24
15.0000 mg | ORAL_CAPSULE | Freq: Every day | ORAL | 0 refills | Status: DC
  Filled 2024-05-14: qty 30, 30d supply, fill #0

## 2024-05-16 ENCOUNTER — Other Ambulatory Visit: Payer: Self-pay

## 2024-05-18 ENCOUNTER — Other Ambulatory Visit: Payer: Self-pay

## 2024-05-19 ENCOUNTER — Other Ambulatory Visit: Payer: Self-pay

## 2024-05-21 ENCOUNTER — Other Ambulatory Visit: Payer: Self-pay

## 2024-05-22 ENCOUNTER — Other Ambulatory Visit: Payer: Self-pay

## 2024-05-23 ENCOUNTER — Other Ambulatory Visit: Payer: Self-pay

## 2024-05-23 MED ORDER — VENLAFAXINE HCL ER 75 MG PO CP24
75.0000 mg | ORAL_CAPSULE | Freq: Every day | ORAL | 1 refills | Status: AC
  Filled 2024-05-23: qty 60, 60d supply, fill #0
  Filled 2024-07-10: qty 60, 60d supply, fill #1

## 2024-05-24 ENCOUNTER — Other Ambulatory Visit: Payer: Self-pay

## 2024-05-25 ENCOUNTER — Other Ambulatory Visit: Payer: Self-pay

## 2024-05-28 ENCOUNTER — Other Ambulatory Visit: Payer: Self-pay

## 2024-05-28 MED ORDER — CYANOCOBALAMIN 1000 MCG/ML IJ SOLN
1000.0000 ug | INTRAMUSCULAR | 2 refills | Status: AC
  Filled 2024-05-28: qty 4, 28d supply, fill #0
  Filled 2024-07-10: qty 4, 28d supply, fill #1

## 2024-06-13 ENCOUNTER — Other Ambulatory Visit: Payer: Self-pay

## 2024-06-15 ENCOUNTER — Other Ambulatory Visit: Payer: Self-pay

## 2024-06-18 ENCOUNTER — Other Ambulatory Visit: Payer: Self-pay

## 2024-06-18 MED ORDER — DEXMETHYLPHENIDATE HCL ER 15 MG PO CP24
15.0000 mg | ORAL_CAPSULE | Freq: Every day | ORAL | 0 refills | Status: AC
  Filled 2024-06-18: qty 30, 30d supply, fill #0

## 2024-07-10 ENCOUNTER — Other Ambulatory Visit: Payer: Self-pay

## 2024-07-11 ENCOUNTER — Other Ambulatory Visit: Payer: Self-pay

## 2024-07-17 ENCOUNTER — Other Ambulatory Visit: Payer: Self-pay
